# Patient Record
Sex: Male | Born: 1955 | Race: Black or African American | Hispanic: No | Marital: Single | Smoking: Current every day smoker
Health system: Southern US, Community
[De-identification: ages and names within clinical notes are randomized; demographics above are authoritative.]

## PROBLEM LIST (undated history)

## (undated) DIAGNOSIS — E7800 Pure hypercholesterolemia, unspecified: Secondary | ICD-10-CM

## (undated) DIAGNOSIS — I700 Atherosclerosis of aorta: Secondary | ICD-10-CM

## (undated) DIAGNOSIS — I10 Essential (primary) hypertension: Secondary | ICD-10-CM

## (undated) DIAGNOSIS — I6529 Occlusion and stenosis of unspecified carotid artery: Secondary | ICD-10-CM

## (undated) DIAGNOSIS — I2510 Atherosclerotic heart disease of native coronary artery without angina pectoris: Secondary | ICD-10-CM

## (undated) HISTORY — DX: Occlusion and stenosis of unspecified carotid artery: I65.29

## (undated) HISTORY — DX: Atherosclerosis of aorta: I70.0

## (undated) HISTORY — PX: BUNIONECTOMY: SHX129

## (undated) HISTORY — DX: Atherosclerotic heart disease of native coronary artery without angina pectoris: I25.10

## (undated) HISTORY — PX: CATARACT EXTRACTION: SUR2

---

## 2007-07-30 ENCOUNTER — Ambulatory Visit (HOSPITAL_COMMUNITY): Admission: RE | Admit: 2007-07-30 | Discharge: 2007-07-30 | Payer: Self-pay | Admitting: Cardiology

## 2007-08-25 ENCOUNTER — Emergency Department (HOSPITAL_COMMUNITY): Admission: EM | Admit: 2007-08-25 | Discharge: 2007-08-25 | Payer: Self-pay | Admitting: Emergency Medicine

## 2008-12-01 ENCOUNTER — Encounter: Admission: RE | Admit: 2008-12-01 | Discharge: 2008-12-01 | Payer: Self-pay | Admitting: Cardiology

## 2009-04-13 ENCOUNTER — Emergency Department (HOSPITAL_COMMUNITY): Admission: EM | Admit: 2009-04-13 | Discharge: 2009-04-13 | Payer: Self-pay | Admitting: Emergency Medicine

## 2009-12-20 ENCOUNTER — Encounter: Admission: RE | Admit: 2009-12-20 | Discharge: 2009-12-20 | Payer: Self-pay | Admitting: Cardiology

## 2010-11-09 LAB — CBC
MCHC: 33.6
RBC: 5.15

## 2010-11-09 LAB — T4: T4, Total: 6.9

## 2010-11-09 LAB — COMPREHENSIVE METABOLIC PANEL
ALT: 27
AST: 28
CO2: 30
Calcium: 9.6
GFR calc Af Amer: >60
Sodium: 142
Total Protein: 7.3

## 2010-11-09 LAB — LIPID PANEL
HDL: 39 — ABNORMAL LOW
Total CHOL/HDL Ratio: 3.7

## 2010-11-09 LAB — PSA: PSA: 3.86

## 2011-02-28 ENCOUNTER — Encounter (HOSPITAL_COMMUNITY): Payer: Self-pay | Admitting: *Deleted

## 2011-02-28 ENCOUNTER — Emergency Department (HOSPITAL_COMMUNITY)
Admission: EM | Admit: 2011-02-28 | Discharge: 2011-02-28 | Disposition: A | Discharge status: Home / Self Care | Payer: 59 | Source: Home / Self Care | Attending: Family Medicine | Admitting: Family Medicine

## 2011-02-28 DIAGNOSIS — Z202 Contact with and (suspected) exposure to infections with a predominantly sexual mode of transmission: Secondary | ICD-10-CM

## 2011-02-28 DIAGNOSIS — H0019 Chalazion unspecified eye, unspecified eyelid: Secondary | ICD-10-CM

## 2011-02-28 HISTORY — DX: Pure hypercholesterolemia, unspecified: E78.00

## 2011-02-28 HISTORY — DX: Essential (primary) hypertension: I10

## 2011-02-28 MED ORDER — METRONIDAZOLE 500 MG PO TABS: 500.0000 mg | ORAL_TABLET | Freq: Two times a day (BID) | ORAL | Status: AC

## 2011-02-28 NOTE — ED Notes (Addendum)
Pt with exposure to trichomoniases - pt with right eyelid bump x 6 weeks denies pain

## 2011-02-28 NOTE — ED Provider Notes (Signed)
History     CSN: [id number]  Arrival date & time 02/28/11  5366   First MD Initiated Contact with Patient 02/28/11 [phone]      Chief Complaint  Patient presents with  . Exposure to STD  . Eye Problem    (Consider location/radiation/quality/duration/timing/severity/associated sxs/prior treatment) HPI Comments: 56 y/o male here c/o: 1) His wife was recently diagnosed with trichomonas and patient was asked to be checked and treated. Denies discharge from penis, dysuria or testicular pain. 2) has noted a "bump" in the middle of upper lids. Present for several months. No tenderness or discharge.   Past Medical History  Diagnosis Date  . Hypertension   . High cholesterol     History reviewed. No pertinent past surgical history.  History reviewed. No pertinent family history.  History  Substance Use Topics  . Smoking status: Current Everyday Smoker  . Smokeless tobacco: Not on file  . Alcohol Use: Yes      Review of Systems  Constitutional: Negative for fever and chills.  HENT: Negative for sore throat.   Eyes: Negative for photophobia, pain, discharge, redness, itching and visual disturbance.  Genitourinary: Negative for dysuria, frequency, hematuria, discharge, scrotal swelling and testicular pain.  Skin: Negative for rash.    Allergies  Review of patient's allergies indicates no known allergies.  Home Medications   Current Outpatient Rx  Name Route Sig Dispense Refill  . PRESCRIPTION MEDICATION  Pt has prescription for htn med and high cholesterol med unknown names    Has not taken either x one month  Will get filled today per pt    . METRONIDAZOLE 500 MG PO TABS Oral Take 1 tablet (500 mg total) by mouth 2 (two) times daily. 14 tablet 0    BP 158/91  Pulse 76  Temp 98 F (36.7 C)  Resp 16  SpO2 99%  Physical Exam  Nursing note and vitals reviewed. Constitutional: He is oriented to person, place, and time. He appears well-developed and well-nourished. No  distress.  HENT:  Head: Normocephalic and atraumatic.  Right Ear: External ear normal.  Left Ear: External ear normal.  Nose: Nose normal.  Mouth/Throat: Oropharynx is clear and moist. No oropharyngeal exudate.  Eyes: Conjunctivae and EOM are normal. Pupils are equal, round, and reactive to light. Right eye exhibits no discharge. Left eye exhibits no discharge.       There round non tender cystic lesions below bilateral upper lids. No erythema or blepharitis. No spontaneous drainage with compression.   Neck: Neck supple. No JVD present.  Cardiovascular: Normal heart sounds.   Pulmonary/Chest: Effort normal and breath sounds normal.  Abdominal: He exhibits no distension.  Genitourinary: Penis normal. No penile tenderness.  Lymphadenopathy:    He has no cervical adenopathy.  Neurological: He is alert and oriented to person, place, and time.  Skin: No rash noted.    ED Course  Procedures (including critical care time)   Labs Reviewed  GC/CHLAMYDIA PROBE AMP, GENITAL   No results found.   1. Exposure to STD   2. Chalazion       MDM  GC/Chl pending. Treated for trichomonas with flagyl bid for 7 days. Non infected chalazion referred to eye specialist as needed.        Sharin Grave, MD 03/01/11 2318

## 2011-11-24 ENCOUNTER — Emergency Department (HOSPITAL_COMMUNITY)
Admission: EM | Admit: 2011-11-24 | Discharge: 2011-11-24 | Disposition: A | Discharge status: Home / Self Care | Payer: 59 | Source: Home / Self Care | Attending: Emergency Medicine | Admitting: Emergency Medicine

## 2011-11-24 ENCOUNTER — Encounter (HOSPITAL_COMMUNITY): Payer: Self-pay | Admitting: Emergency Medicine

## 2011-11-24 DIAGNOSIS — L723 Sebaceous cyst: Secondary | ICD-10-CM

## 2011-11-24 MED ORDER — DOXYCYCLINE HYCLATE 100 MG PO CAPS: 100.0000 mg | ORAL_CAPSULE | Freq: Two times a day (BID) | ORAL | Status: DC

## 2011-11-24 MED ORDER — HYDROCODONE-ACETAMINOPHEN 5-325 MG PO TABS: 2.0000 | ORAL_TABLET | ORAL | Status: DC | PRN

## 2011-11-24 NOTE — ED Provider Notes (Signed)
History     CSN: [id number]  Arrival date & time 11/24/11  7829   First MD Initiated Contact with Patient 11/24/11 [phone]      Chief Complaint  Patient presents with  . Abscess    (Consider location/radiation/quality/duration/timing/severity/associated sxs/prior treatment) Patient is a 56 y.o. male presenting with abscess. The history is provided by the patient. No language interpreter was used.  Abscess  This is a recurrent problem. The problem has been gradually worsening. The abscess is present on the back. The abscess is characterized by redness. The abscess first occurred at home. There were no sick contacts.   Pt reports he has an abscess on his back.  Pt reports area is getting larger.  Pt has had a knot in the area for a long time Past Medical History  Diagnosis Date  . Hypertension   . High cholesterol     History reviewed. No pertinent past surgical history.  No family history on file.  History  Substance Use Topics  . Smoking status: Current Every Day Smoker -- 1.0 packs/day    Types: Cigarettes  . Smokeless tobacco: Not on file  . Alcohol Use: Yes      Review of Systems  Skin: Positive for wound.  All other systems reviewed and are negative.    Allergies  Review of patient's allergies indicates no known allergies.  Home Medications   Current Outpatient Rx  Name Route Sig Dispense Refill  . PRESCRIPTION MEDICATION  Pt has prescription for htn med and high cholesterol med unknown names    Has not taken either x one month  Will get filled today per pt      BP 161/81  Pulse 69  Temp 98.4 F (36.9 C) (Oral)  Resp 18  SpO2 99%  Physical Exam  Nursing note and vitals reviewed. Constitutional: He appears well-developed and well-nourished.  HENT:  Head: Normocephalic and atraumatic.  Eyes: Conjunctivae normal are normal. Pupils are equal, round, and reactive to light.  Cardiovascular: Normal rate.   Pulmonary/Chest: Effort normal.    Musculoskeletal:       3x3 cm swollen area,  Large sebaceous cyst     ED Course  INCISION AND DRAINAGE Date/Time: 11/24/2011 10:15 AM Performed by: Elson Areas Authorized by: Elson Areas Consent: Verbal consent not obtained. Risks and benefits: risks, benefits and alternatives were discussed Patient understanding: patient does not state understanding of the procedure being performed Patient identity confirmed: verbally with patient Type: cyst Body area: trunk Location details: back Anesthesia: local infiltration Patient sedated: no Scalpel size: 11 Incision type: single straight Complexity: simple Drainage amount: moderate Wound treatment: wound left open Packing material: none Patient tolerance: Patient tolerated the procedure well with no immediate complications. Comments: Large amount of cystic material removed   (including critical care time)  Labs Reviewed - No data to display No results found.   No diagnosis found.    MDM  Bactrim ds         Lonia Skinner [place], [place] 11/24/11 1017

## 2011-11-24 NOTE — ED Notes (Addendum)
Pt c/o of abcess on middle of back x1 year... Had no pain until this week... 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 Sx include: tenderness... Denies: drainage, fevers, vomiting, nausea, diarrhea.

## 2011-11-26 NOTE — ED Provider Notes (Signed)
Medical screening examination/treatment/procedure(s) were performed by non-physician practitioner and as supervising physician I was immediately available for consultation/collaboration.  Leslee Home, M.D.   Reuben Likes, MD 11/26/11 [phone]

## 2016-02-22 DIAGNOSIS — E559 Vitamin D deficiency, unspecified: Secondary | ICD-10-CM | POA: Diagnosis not present

## 2016-04-25 DIAGNOSIS — Z0000 Encounter for general adult medical examination without abnormal findings: Secondary | ICD-10-CM | POA: Diagnosis not present

## 2016-04-25 DIAGNOSIS — Z125 Encounter for screening for malignant neoplasm of prostate: Secondary | ICD-10-CM | POA: Diagnosis not present

## 2016-04-25 DIAGNOSIS — Z13220 Encounter for screening for lipoid disorders: Secondary | ICD-10-CM | POA: Diagnosis not present

## 2016-12-12 DIAGNOSIS — E785 Hyperlipidemia, unspecified: Secondary | ICD-10-CM | POA: Diagnosis not present

## 2016-12-12 DIAGNOSIS — E291 Testicular hypofunction: Secondary | ICD-10-CM | POA: Diagnosis not present

## 2016-12-12 DIAGNOSIS — I10 Essential (primary) hypertension: Secondary | ICD-10-CM | POA: Diagnosis not present

## 2016-12-19 DIAGNOSIS — I10 Essential (primary) hypertension: Secondary | ICD-10-CM | POA: Diagnosis not present

## 2016-12-19 DIAGNOSIS — E291 Testicular hypofunction: Secondary | ICD-10-CM | POA: Diagnosis not present

## 2016-12-19 DIAGNOSIS — E785 Hyperlipidemia, unspecified: Secondary | ICD-10-CM | POA: Diagnosis not present

## 2017-01-16 DIAGNOSIS — Z0100 Encounter for examination of eyes and vision without abnormal findings: Secondary | ICD-10-CM | POA: Diagnosis not present

## 2017-05-01 DIAGNOSIS — Z0000 Encounter for general adult medical examination without abnormal findings: Secondary | ICD-10-CM | POA: Diagnosis not present

## 2017-05-01 DIAGNOSIS — I10 Essential (primary) hypertension: Secondary | ICD-10-CM | POA: Diagnosis not present

## 2017-05-01 DIAGNOSIS — E785 Hyperlipidemia, unspecified: Secondary | ICD-10-CM | POA: Diagnosis not present

## 2017-08-07 DIAGNOSIS — E785 Hyperlipidemia, unspecified: Secondary | ICD-10-CM | POA: Diagnosis not present

## 2017-10-23 DIAGNOSIS — I10 Essential (primary) hypertension: Secondary | ICD-10-CM | POA: Diagnosis not present

## 2017-10-23 DIAGNOSIS — E785 Hyperlipidemia, unspecified: Secondary | ICD-10-CM | POA: Diagnosis not present

## 2017-10-23 DIAGNOSIS — R972 Elevated prostate specific antigen [PSA]: Secondary | ICD-10-CM | POA: Diagnosis not present

## 2018-04-12 DIAGNOSIS — H40033 Anatomical narrow angle, bilateral: Secondary | ICD-10-CM | POA: Diagnosis not present

## 2018-04-12 DIAGNOSIS — H2513 Age-related nuclear cataract, bilateral: Secondary | ICD-10-CM | POA: Diagnosis not present

## 2019-05-13 ENCOUNTER — Other Ambulatory Visit: Payer: Self-pay | Admitting: Family Medicine

## 2019-05-13 DIAGNOSIS — R1011 Right upper quadrant pain: Secondary | ICD-10-CM

## 2019-05-27 ENCOUNTER — Ambulatory Visit
Admission: RE | Admit: 2019-05-27 | Discharge: 2019-05-27 | Disposition: A | Discharge status: Home / Self Care | Payer: 59 | Source: Ambulatory Visit | Attending: Family Medicine | Admitting: Family Medicine

## 2019-05-27 DIAGNOSIS — R1011 Right upper quadrant pain: Secondary | ICD-10-CM

## 2020-04-15 ENCOUNTER — Other Ambulatory Visit: Payer: Self-pay | Admitting: Family Medicine

## 2020-04-15 DIAGNOSIS — Z122 Encounter for screening for malignant neoplasm of respiratory organs: Secondary | ICD-10-CM

## 2020-04-15 DIAGNOSIS — R1033 Periumbilical pain: Secondary | ICD-10-CM

## 2020-05-04 ENCOUNTER — Other Ambulatory Visit: Payer: Self-pay

## 2020-05-04 ENCOUNTER — Ambulatory Visit
Admission: RE | Admit: 2020-05-04 | Discharge: 2020-05-04 | Disposition: A | Discharge status: Home / Self Care | Payer: 59 | Source: Ambulatory Visit | Attending: Family Medicine | Admitting: Family Medicine

## 2020-05-04 DIAGNOSIS — Z122 Encounter for screening for malignant neoplasm of respiratory organs: Secondary | ICD-10-CM

## 2020-05-04 DIAGNOSIS — R1033 Periumbilical pain: Secondary | ICD-10-CM

## 2020-05-04 MED ORDER — IOPAMIDOL (ISOVUE-300) INJECTION 61%
100.0000 mL | Freq: Once | INTRAVENOUS | Status: AC | PRN
  Administered 2020-05-04: 100 mL via INTRAVENOUS

## 2020-05-24 NOTE — Progress Notes (Signed)
Primary Physician/Referring:  Aura Dials, MD Reason for referral: Abnormal chest CT Patient ID: Gregory Barajas, male    DOB: 02/07/1956, 65 y.o.   MRN: [id number]  Chief Complaint  Patient presents with  . Abnormal Chest CT  . Coronary Artery Disease   HPI:    Gregory Barajas  is a 65 y.o. African-American male with history of hypertension, hyperlipidemia, and current pack per day smoker for the last 30 years.  Patient denies history of diabetes, MI, TIA/CVA, family history of premature CAD.  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  Patient was referred to our office by PCP Dr. Sheryn Bison after recent CT scanning of the chest for lung cancer screening revealed coronary artery calcifications and aortic atherosclerosis.  Patient denies chest pain, palpitations, syncope, near syncope, dizziness, leg swelling, orthopnea, PND.  Patient does report a long history of dyspnea on exertion, which she is always attributed to smoking.  Patient does admit to poor diet and drinks 1-2 beers per night.  He also eats out approximately 3-4 times per week.  He has had no prior cardiac evaluation.  Past Medical History:  Diagnosis Date  . High cholesterol   . Hypertension    Past Surgical History:  Procedure Laterality Date  . BUNIONECTOMY    . CATARACT EXTRACTION     Family History  Problem Relation Age of Onset  . Cancer Father     Social History   Tobacco Use  . Smoking status: Current Every Day Smoker    Packs/day: 1.00    Types: Cigarettes  . Smokeless tobacco: Not on file  Substance Use Topics  . Alcohol use: Yes   Marital Status: Single   ROS  Review of Systems  Constitutional: Negative for malaise/fatigue and weight gain.  Cardiovascular: Negative for chest pain, claudication, leg swelling, near-syncope, orthopnea, palpitations, paroxysmal nocturnal dyspnea and syncope.  Respiratory: Negative for shortness of breath.   Hematologic/Lymphatic: Does not  bruise/bleed easily.  Gastrointestinal: Negative for melena.  Neurological: Negative for dizziness and weakness.    Objective  Blood pressure 126/68, pulse 86, temperature 98 F (36.7 C), height 5' 7"  (1.702 m), weight 172 lb (78 kg), SpO2 97 %.  Vitals with BMI 05/25/2020 11/24/2011  Height 5' 7"  -  Weight 172 lbs -  BMI 51.02 -  Systolic 585 277  Diastolic 68 81  Pulse 86 69  Some encounter information is confidential and restricted. Go to Review Flowsheets activity to see all data.      Physical Exam Vitals reviewed.  HENT:     Head: Normocephalic and atraumatic.  Cardiovascular:     Rate and Rhythm: Normal rate and regular rhythm.     Pulses: Intact distal pulses.          Carotid pulses are 2+ on the right side and 2+ on the left side.      Radial pulses are 2+ on the right side and 2+ on the left side.       Femoral pulses are 2+ on the right side and 2+ on the left side.      Popliteal pulses are 2+ on the right side and 2+ on the left side.       Dorsalis pedis pulses are 2+ on the right side and 2+ on the left side.       Posterior tibial pulses are 2+ on the right side and 2+ on the left side.     Heart sounds: S1 normal  and S2 normal. No murmur heard. No gallop.   Pulmonary:     Effort: Pulmonary effort is normal. No respiratory distress.     Breath sounds: No wheezing, rhonchi or rales.  Abdominal:     General: Bowel sounds are normal. There is no distension.     Palpations: Abdomen is soft.  Musculoskeletal:     Right lower leg: No edema.     Left lower leg: No edema.  Neurological:     Mental Status: He is alert.     Laboratory examination:   No results for input(s): NA, K, CL, CO2, GLUCOSE, BUN, CREATININE, CALCIUM, GFRNONAA, GFRAA in the last 8760 hours. CrCl cannot be calculated (Patient's most recent lab result is older than the maximum 21 days allowed.).  CMP 07/30/2007  Glucose 105(H)  BUN 10  Creatinine 1.12  Sodium 142  Potassium 4.2   Chloride 106  CO2 30  Calcium 9.6  Total Protein 7.3  Total Bilirubin 0.7  Alkaline Phos 65  AST 28  ALT 27   CBC 07/30/2007  WBC 6.5  Hemoglobin 15.0  Hematocrit 44.5  Platelets 307    Lipid Panel No results for input(s): CHOL, TRIG, LDLCALC, VLDL, HDL, CHOLHDL, LDLDIRECT in the last 8760 hours.  HEMOGLOBIN A1C No results found for: HGBA1C, MPG TSH No results for input(s): TSH in the last 8760 hours.  External labs:   04/01/2020: Hemoglobin 15.2, hematocrit 46.1, MCV 85.9, platelet 285 Glucose 75, BUN 11, creatinine 1.24, sodium 140, potassium 4.2, alk phos 95, AST 23, ALT 22 Total cholesterol 178, HDL 56, triglycerides 93, LDL 105  Medications and allergies  No Known Allergies   Outpatient Medications Prior to Visit  Medication Sig Dispense Refill  . amLODipine (NORVASC) 10 MG tablet Take 1 tablet by mouth daily.    Marland Kitchen lisinopril (ZESTRIL) 20 MG tablet Take 1 tablet by mouth daily.    . rosuvastatin (CRESTOR) 20 MG tablet Take 20 mg by mouth daily.    . tadalafil (CIALIS) 20 MG tablet Take 20 mg by mouth as directed.    . clidinium-chlordiazePOXIDE (LIBRAX) 5-2.5 MG capsule Take 1 capsule by mouth in the morning, at noon, and at bedtime.    . Vitamin D, Ergocalciferol, (DRISDOL) 1.25 MG (50000 UNIT) CAPS capsule Take 5,000 mcg by mouth daily.    Marland Kitchen atorvastatin (LIPITOR) 80 MG tablet Take 1 tablet by mouth daily.    Marland Kitchen doxycycline (VIBRAMYCIN) 100 MG capsule Take 1 capsule (100 mg total) by mouth 2 (two) times daily. 20 capsule 0  . HYDROcodone-acetaminophen (NORCO/VICODIN) 5-325 MG per tablet Take 2 tablets by mouth every 4 (four) hours as needed for pain. 10 tablet 0  . PRESCRIPTION MEDICATION Pt has prescription for htn med and high cholesterol med unknown names    Has not taken either x one month  Will get filled today per pt     No facility-administered medications prior to visit.     Radiology:   No results found. CT abdomen pelvis with contrast  05/04/2020: Lower chest: Minimal dependent bibasilar atelectasis or scarring. Hepatobiliary: No solid liver abnormality is seen. No gallstones,gallbladder wall thickening, or biliary dilatation. Pancreas: Unremarkable. No pancreatic ductal dilatation orsurrounding inflammatory changes. Spleen: Normal in size without significant abnormality. Adrenals/Urinary Tract: Adrenal glands are unremarkable. Kidneys arenormal, without renal calculi, solid lesion, or hydronephrosis.Bladder is unremarkable. Stomach/Bowel: Stomach is within normal limits. Appendix appearsnormal. No evidence of bowel wall thickening, distention, orinflammatory changes. Vascular/Lymphatic: Aortic atherosclerosis. No enlarged abdominal or pelvic lymph  nodes.  CT chest lung cancer screening 05/04/2020: 1. Lung-RADS 2S, benign appearance or behavior. Continue annual screening with low-dose chest CT without contrast in 12 months. 2. The "S" modifier above refers to potentially clinically significant non lung cancer related findings. Specifically, there is aortic atherosclerosis, in addition to left main and 2 vessel coronary artery disease. Please note that although the presence of coronary artery calcium documents the presence of coronary artery disease, the severity of this disease and any potential stenosis cannot be assessed on this non-gated CT examination. Assessment for potential risk factor modification, dietary therapy or pharmacologic therapy may be warranted, if clinically indicated. 3. Mild diffuse bronchial wall thickening with very mild centrilobular and paraseptal emphysema; imaging findings suggestive of underlying COPD. Aortic Atherosclerosis (ICD10-I70.0) and Emphysema (ICD10-J43.9).  Cardiac Studies:   None  EKG:   05/25/2020: Sinus rhythm at a rate of 82 bpm.  Biatrial enlargement.  Left axis, left anterior fascicular block.  Poor R wave progression, cannot exclude anteroseptal infarct old.  Nonspecific T  wave abnormality.  Assessment     ICD-10-CM   1. Atherosclerosis of native coronary artery of native heart without angina pectoris  I25.10 EKG 12-Lead    PCV ECHOCARDIOGRAM COMPLETE    PCV MYOCARDIAL PERFUSION WO LEXISCAN    aspirin EC 81 MG tablet  2. Atherosclerosis of aorta (HCC)  I70.0 aspirin EC 81 MG tablet  3. Dyspnea on exertion  R06.00 PCV ECHOCARDIOGRAM COMPLETE    PCV MYOCARDIAL PERFUSION WO LEXISCAN     Medications Discontinued During This Encounter  Medication Reason  . doxycycline (VIBRAMYCIN) 100 MG capsule Error  . HYDROcodone-acetaminophen (NORCO/VICODIN) 5-325 MG per tablet Error  . PRESCRIPTION MEDICATION Error  . clidinium-chlordiazePOXIDE (LIBRAX) 5-2.5 MG capsule   . atorvastatin (LIPITOR) 80 MG tablet Error  . Vitamin D, Ergocalciferol, (DRISDOL) 1.25 MG (50000 UNIT) CAPS capsule Error    Meds ordered this encounter  Medications  . aspirin EC 81 MG tablet    Sig: Take 1 tablet (81 mg total) by mouth daily. Swallow whole.    Dispense:  90 tablet    Refill:  3    Recommendations:   Arihaan Bellucci is a 65 y.o. African-American male with history of hypertension, hyperlipidemia, and current pack per day smoker for the last 30 years.  Patient denies history of diabetes, MI, TIA/CVA, family history of premature CAD.  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  Patient was referred to our office by PCP Dr. Sheryn Bison after recent CT scanning of the chest for lung cancer screening revealed coronary artery calcifications and aortic atherosclerosis.  Patient's blood pressure is well controlled, will defer further management to his PCP.  Review of external labs revealed lipids are not well controlled, however patient recently started started on rosuvastatin by PCP.  Will defer further management of hyperlipidemia to primary care at this time as well.  In regard to recent CT, I have personally reviewed the results and in view of coronary artery  calcification and patient's other underlying risk factors for coronary artery disease, as well as patient's dyspnea on exertion, recommend echocardiogram and stress test.  Also recommend patient start taking aspirin 81 mg daily in view of coronary artery disease.  Otherwise advised patient to continue present medications including amlodipine, lisinopril, and rosuvastatin.  In regard to patient's symptoms of dyspnea on exertion, do suspect this is likely multifactorial including smoking and underlying lung pathology.  Also patient has no formal exercise routine.  Counseled patient  at length regarding diet and lifestyle modifications to reduce cardiovascular risk including smoking cessation, diet compliance, and increased physical activity.  Patient verbalized understanding agreement.  Patient appears motivated to quit smoking as well.  Follow-up in 8 weeks, sooner if needed, for results of cardiac testing.   Alethia Berthold, PA-C 05/27/2020, 1:29 PM Office: [phone]

## 2020-05-25 ENCOUNTER — Encounter: Payer: Self-pay | Admitting: Student

## 2020-05-25 ENCOUNTER — Ambulatory Visit: Payer: 59 | Admitting: Student

## 2020-05-25 ENCOUNTER — Other Ambulatory Visit: Payer: Self-pay

## 2020-05-25 ENCOUNTER — Ambulatory Visit: Payer: Self-pay | Admitting: Cardiology

## 2020-05-25 VITALS — BP 126/68 | HR 86 | Temp 98.0°F | Ht 67.0 in | Wt 172.0 lb

## 2020-05-25 DIAGNOSIS — R0600 Dyspnea, unspecified: Secondary | ICD-10-CM

## 2020-05-25 DIAGNOSIS — I700 Atherosclerosis of aorta: Secondary | ICD-10-CM

## 2020-05-25 DIAGNOSIS — I2510 Atherosclerotic heart disease of native coronary artery without angina pectoris: Secondary | ICD-10-CM

## 2020-05-25 DIAGNOSIS — R0609 Other forms of dyspnea: Secondary | ICD-10-CM

## 2020-05-25 MED ORDER — ASPIRIN EC 81 MG PO TBEC: 81.0000 mg | DELAYED_RELEASE_TABLET | Freq: Every day | ORAL | 3 refills | Status: DC

## 2020-05-27 ENCOUNTER — Encounter: Payer: Self-pay | Admitting: Student

## 2020-06-15 ENCOUNTER — Ambulatory Visit: Payer: 59

## 2020-06-15 ENCOUNTER — Other Ambulatory Visit: Payer: Self-pay

## 2020-06-15 DIAGNOSIS — R0600 Dyspnea, unspecified: Secondary | ICD-10-CM

## 2020-06-15 DIAGNOSIS — I2510 Atherosclerotic heart disease of native coronary artery without angina pectoris: Secondary | ICD-10-CM

## 2020-06-15 DIAGNOSIS — R0609 Other forms of dyspnea: Secondary | ICD-10-CM

## 2020-06-20 NOTE — Progress Notes (Signed)
Please inform patient echocardiogram revealed normal heart function with leaking of aortic and mitral valves.  We will discuss further at upcoming office visit following additional cardiovascular testing.

## 2020-06-21 NOTE — Progress Notes (Signed)
Patient is aware 

## 2020-06-29 ENCOUNTER — Other Ambulatory Visit: Payer: Self-pay

## 2020-06-29 ENCOUNTER — Ambulatory Visit: Payer: 59

## 2020-06-29 DIAGNOSIS — R0609 Other forms of dyspnea: Secondary | ICD-10-CM

## 2020-06-29 DIAGNOSIS — I2510 Atherosclerotic heart disease of native coronary artery without angina pectoris: Secondary | ICD-10-CM

## 2020-06-29 DIAGNOSIS — R0600 Dyspnea, unspecified: Secondary | ICD-10-CM

## 2020-06-30 NOTE — Progress Notes (Signed)
Please advise patient stress test was low risk, will discuss further at upcoming office visit.

## 2020-06-30 NOTE — Progress Notes (Signed)
Called and spoke with patient regarding his stress test results.

## 2020-07-19 NOTE — Progress Notes (Signed)
Primary Physician/Referring:  Aura Dials, MD Reason for referral: Abnormal chest CT Patient ID: Gregory Barajas, male    DOB: [birth year], 65 y.o.   MRN: [id number]  Chief Complaint  Patient presents with  . Follow-up    8 WEEK   HPI:    Gregory Barajas  is a 65 y.o. African-American male with history of hypertension, hyperlipidemia, and current pack per day smoker for the last 30 years.  Patient denies history of diabetes, MI, TIA/CVA, family history of premature CAD.  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  He was originally referred to our office by Dr. Sheryn Bison after incidental finding of coronary artery calcifications and aortic atherosclerosis on chest CT for lung cancer screening.  Patient presents for 8 week follow up for results of cardiac testing.  Patient remains asymptomatic, denies chest pain, palpitations, syncope, near syncope, dizziness, leg swelling, orthopnea, PND.  Unfortunately he does continue to smoke approximately a pack per day and drinks 1-2 beers per night.  Patient appears motivated to quit smoking, stating that he quit for 8 years in the past and plans to do it again.  Since last visit patient underwent stress test which was low risk and echocardiogram revealing normal LVEF, hypertensive heart disease, and moderate aortic regurgitation and mitral regurgitation.  Past Medical History:  Diagnosis Date  . High cholesterol   . Hypertension    Past Surgical History:  Procedure Laterality Date  . BUNIONECTOMY    . CATARACT EXTRACTION     Family History  Problem Relation Age of Onset  . Cancer Brother     Social History   Tobacco Use  . Smoking status: Current Every Day Smoker    Packs/day: 1.00    Types: Cigarettes  . Smokeless tobacco: Never Used  Substance Use Topics  . Alcohol use: Yes    Alcohol/week: 1.0 standard drink    Types: 1 Glasses of wine per week    Comment: OCC   Marital Status: Single   ROS  Review of  Systems  Constitutional: Negative for malaise/fatigue and weight gain.  Cardiovascular: Negative for chest pain, claudication, leg swelling, near-syncope, orthopnea, palpitations, paroxysmal nocturnal dyspnea and syncope.  Respiratory: Negative for shortness of breath.   Hematologic/Lymphatic: Does not bruise/bleed easily.  Gastrointestinal: Negative for melena.  Neurological: Negative for dizziness and weakness.    Objective  Blood pressure 135/60, pulse 67, temperature 98.6 F (37 C), temperature source Temporal, resp. rate 17, height 5' 7"  (1.702 m), weight 175 lb 6.4 oz (79.6 kg), SpO2 95 %.  Vitals with BMI 07/20/2020 07/20/2020 05/25/2020  Height - 5' 7"  5' 7"   Weight - 175 lbs 6 oz 172 lbs  BMI - 34.91 79.15  Systolic 056 979 480  Diastolic 60 69 68  Pulse 67 72 86  Some encounter information is confidential and restricted. Go to Review Flowsheets activity to see all data.      Physical Exam Vitals reviewed.  Cardiovascular:     Rate and Rhythm: Normal rate and regular rhythm.     Pulses: Intact distal pulses.          Carotid pulses are 2+ on the right side and 2+ on the left side.      Radial pulses are 2+ on the right side and 2+ on the left side.       Femoral pulses are 2+ on the right side and 2+ on the left side.      Popliteal pulses are  2+ on the right side and 2+ on the left side.       Dorsalis pedis pulses are 2+ on the right side and 2+ on the left side.       Posterior tibial pulses are 2+ on the right side and 2+ on the left side.     Heart sounds: S1 normal and S2 normal. No murmur heard. No gallop.   Pulmonary:     Effort: Pulmonary effort is normal. No respiratory distress.     Breath sounds: No wheezing, rhonchi or rales.  Musculoskeletal:     Right lower leg: No edema.     Left lower leg: No edema.  Neurological:     Mental Status: He is alert.     Laboratory examination:   No results for input(s): NA, K, CL, CO2, GLUCOSE, BUN, CREATININE,  CALCIUM, GFRNONAA, GFRAA in the last 8760 hours. CrCl cannot be calculated (Patient's most recent lab result is older than the maximum 21 days allowed.).  CMP 07/30/2007  Glucose 105(H)  BUN 10  Creatinine 1.12  Sodium 142  Potassium 4.2  Chloride 106  CO2 30  Calcium 9.6  Total Protein 7.3  Total Bilirubin 0.7  Alkaline Phos 65  AST 28  ALT 27   CBC 07/30/2007  WBC 6.5  Hemoglobin 15.0  Hematocrit 44.5  Platelets 307    Lipid Panel No results for input(s): CHOL, TRIG, LDLCALC, VLDL, HDL, CHOLHDL, LDLDIRECT in the last 8760 hours.  HEMOGLOBIN A1C No results found for: HGBA1C, MPG TSH No results for input(s): TSH in the last 8760 hours.  External labs:   04/01/2020: Hemoglobin 15.2, hematocrit 46.1, MCV 85.9, platelet 285 Glucose 75, BUN 11, creatinine 1.24, sodium 140, potassium 4.2, alk phos 95, AST 23, ALT 22 Total cholesterol 178, HDL 56, triglycerides 93, LDL 105  Allergies  No Known Allergies    Medications Prior to Visit:   Outpatient Medications Prior to Visit  Medication Sig Dispense Refill  . amLODipine (NORVASC) 10 MG tablet Take 1 tablet by mouth daily.    Marland Kitchen aspirin EC 81 MG tablet Take 1 tablet (81 mg total) by mouth daily. Swallow whole. 90 tablet 3  . lisinopril (ZESTRIL) 20 MG tablet Take 1 tablet by mouth daily.    . rosuvastatin (CRESTOR) 20 MG tablet Take 20 mg by mouth daily.    . tadalafil (CIALIS) 20 MG tablet Take 20 mg by mouth as directed.     No facility-administered medications prior to visit.     Final Medications at End of Visit    Current Meds  Medication Sig  . amLODipine (NORVASC) 10 MG tablet Take 1 tablet by mouth daily.  Marland Kitchen aspirin EC 81 MG tablet Take 1 tablet (81 mg total) by mouth daily. Swallow whole.  . lisinopril (ZESTRIL) 20 MG tablet Take 1 tablet by mouth daily.  . metoprolol succinate (TOPROL XL) 25 MG 24 hr tablet Take 1 tablet (25 mg total) by mouth daily.  . rosuvastatin (CRESTOR) 20 MG tablet Take 20 mg by  mouth daily.  . tadalafil (CIALIS) 20 MG tablet Take 20 mg by mouth as directed.    Radiology:   No results found. CT abdomen pelvis with contrast 05/04/2020: Lower chest: Minimal dependent bibasilar atelectasis or scarring. Hepatobiliary: No solid liver abnormality is seen. No gallstones,gallbladder wall thickening, or biliary dilatation. Pancreas: Unremarkable. No pancreatic ductal dilatation orsurrounding inflammatory changes. Spleen: Normal in size without significant abnormality. Adrenals/Urinary Tract: Adrenal glands are unremarkable. Kidneys arenormal, without  renal calculi, solid lesion, or hydronephrosis.Bladder is unremarkable. Stomach/Bowel: Stomach is within normal limits. Appendix appearsnormal. No evidence of bowel wall thickening, distention, orinflammatory changes. Vascular/Lymphatic: Aortic atherosclerosis. No enlarged abdominal or pelvic lymph nodes.  CT chest lung cancer screening 05/04/2020: 1. Lung-RADS 2S, benign appearance or behavior. Continue annual screening with low-dose chest CT without contrast in 12 months. 2. The "S" modifier above refers to potentially clinically significant non lung cancer related findings. Specifically, there is aortic atherosclerosis, in addition to left main and 2 vessel coronary artery disease. Please note that although the presence of coronary artery calcium documents the presence of coronary artery disease, the severity of this disease and any potential stenosis cannot be assessed on this non-gated CT examination. Assessment for potential risk factor modification, dietary therapy or pharmacologic therapy may be warranted, if clinically indicated. 3. Mild diffuse bronchial wall thickening with very mild centrilobular and paraseptal emphysema; imaging findings suggestive of underlying COPD. Aortic Atherosclerosis (ICD10-I70.0) and Emphysema (ICD10-J43.9).  Cardiac Studies:   PCV ECHOCARDIOGRAM COMPLETE 06/15/2020 Left ventricle  cavity is normal in size. Mild concentric hypertrophy of the left ventricle. Normal global wall motion. Normal LV systolic function with EF 55%. Doppler evidence of grade II (pseudonormal) diastolic dysfunction, elevated LAP. Left atrial cavity is severely dilated. Trileaflet aortic valve.  Moderate (Grade III) aortic regurgitation. Moderate (Grade II) mitral regurgitation. Mild to moderate tricuspid regurgitation. Estimated pulmonary artery systolic pressure 28 mmHg.   PCV MYOCARDIAL PERFUSION WO LEXISCAN 06/29/2020 1 Day Rest/Stress Protocol. Exercise time 6 minutes, achieved 7.23METS, and 86% of age predicted maximum heart rate. No convincing evidence of reversible myocardial ischemia or prior infarct. Left ventricular ejection fraction is 56% with normal wall motion. Low risk study. No prior studies for comparison.  EKG:   05/25/2020: Sinus rhythm at a rate of 82 bpm.  Biatrial enlargement.  Left axis, left anterior fascicular block.  Poor R wave progression, cannot exclude anteroseptal infarct old.  Nonspecific T wave abnormality.  Assessment     ICD-10-CM   1. Atherosclerosis of native coronary artery of native heart without angina pectoris  I25.10   2. Atherosclerosis of aorta (HCC)  I70.0   3. Moderate mitral regurgitation  I34.0   4. Moderate aortic regurgitation  I35.1   5. Diastolic dysfunction without heart failure  I51.89   6. Nicotine dependence, cigarettes, uncomplicated  J47.829      There are no discontinued medications.  Meds ordered this encounter  Medications  . metoprolol succinate (TOPROL XL) 25 MG 24 hr tablet    Sig: Take 1 tablet (25 mg total) by mouth daily.    Dispense:  30 tablet    Refill:  3    Recommendations:   Williams Dietrick is a 65 y.o. African-American male with history of hypertension, hyperlipidemia, and current pack per day smoker for the last 30 years.  Patient denies history of diabetes, MI, TIA/CVA, family history of premature CAD.   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  Patient presents for 8 week follow up for results of cardiac testing.  Reviewed and discussed with patient regarding results of nuclear stress test and echocardiogram, details above.  Echocardiogram revealed normal LVEF with hypertensive heart disease and moderate aortic regurgitation mitral regurgitation.  Advised patient we will continue to monitor with regular echocardiogram surveillance.  Patient's blood pressure is mildly elevated in the office today, will therefore start metoprolol succinate 25 mg once daily.  Notably patient had lab work drawn by PCP Dr. March Rummage  office earlier today, will request records.  Patient remains asymptomatic, will therefore continue medical therapy and primary prevention.  Continue aspirin, rosuvastatin, lisinopril, and add metoprolol.  Counseled patient regarding diet and lifestyle modifications.  Spent 5 minutes counseling patient regarding tobacco cessation.  Patient agrees to commit to July 22, 2020 as quit date.  Patient is otherwise stable from a cardiovascular standpoint.  Follow-up in 3 months, sooner if needed hyperlipidemia, hypertension, MR, AR, diastolic dysfunction, and smoking cessation.   Alethia Berthold, PA-C 07/20/2020, 2:07 PM Office: [phone]

## 2020-07-20 ENCOUNTER — Ambulatory Visit: Payer: 59 | Admitting: Student

## 2020-07-20 ENCOUNTER — Other Ambulatory Visit: Payer: Self-pay

## 2020-07-20 ENCOUNTER — Encounter: Payer: Self-pay | Admitting: Student

## 2020-07-20 VITALS — BP 135/60 | HR 67 | Temp 98.6°F | Resp 17 | Ht 67.0 in | Wt 175.4 lb

## 2020-07-20 DIAGNOSIS — I2510 Atherosclerotic heart disease of native coronary artery without angina pectoris: Secondary | ICD-10-CM

## 2020-07-20 DIAGNOSIS — I340 Nonrheumatic mitral (valve) insufficiency: Secondary | ICD-10-CM

## 2020-07-20 DIAGNOSIS — I700 Atherosclerosis of aorta: Secondary | ICD-10-CM

## 2020-07-20 DIAGNOSIS — I351 Nonrheumatic aortic (valve) insufficiency: Secondary | ICD-10-CM

## 2020-07-20 DIAGNOSIS — F17210 Nicotine dependence, cigarettes, uncomplicated: Secondary | ICD-10-CM

## 2020-07-20 DIAGNOSIS — I5189 Other ill-defined heart diseases: Secondary | ICD-10-CM

## 2020-07-20 MED ORDER — METOPROLOL SUCCINATE ER 25 MG PO TB24: 25.0000 mg | ORAL_TABLET | Freq: Every day | ORAL | 3 refills | Status: DC

## 2020-10-19 ENCOUNTER — Encounter: Payer: Self-pay | Admitting: Student

## 2020-10-19 ENCOUNTER — Ambulatory Visit: Payer: 59 | Admitting: Student

## 2020-10-19 ENCOUNTER — Other Ambulatory Visit: Payer: Self-pay

## 2020-10-19 VITALS — BP 140/63 | HR 71 | Temp 98.7°F | Resp 16 | Ht 67.0 in | Wt 174.0 lb

## 2020-10-19 DIAGNOSIS — F17210 Nicotine dependence, cigarettes, uncomplicated: Secondary | ICD-10-CM

## 2020-10-19 DIAGNOSIS — I10 Essential (primary) hypertension: Secondary | ICD-10-CM

## 2020-10-19 DIAGNOSIS — I2510 Atherosclerotic heart disease of native coronary artery without angina pectoris: Secondary | ICD-10-CM

## 2020-10-19 MED ORDER — METOPROLOL SUCCINATE ER 50 MG PO TB24: 50.0000 mg | ORAL_TABLET | Freq: Every day | ORAL | 3 refills | Status: DC

## 2020-10-19 NOTE — Progress Notes (Signed)
Primary Physician/Referring:  Aura Dials, MD Reason for referral: Abnormal chest CT Patient ID: Gregory Barajas, male    DOB: 1955/04/02, 65 y.o.   MRN: [id number]  Chief Complaint  Patient presents with   Atherosclerosis of native coronary artery of native heart w   Follow-up    3 month   Hypertension   Hyperlipidemia   HPI:    Gregory Barajas  is a 65 y.o. African-American male with history of hypertension, hyperlipidemia, and current pack per day smoker for the last 30 years.  Patient denies history of diabetes, MI, TIA/CVA, family history of premature CAD.  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  He was originally referred to our office by Dr. Sheryn Bison after incidental finding of coronary artery calcifications and aortic atherosclerosis on chest CT for lung cancer screening.  Patient subsequently underwent low risk stress test and echocardiogram revealing normal LVEF, hypertensive heart disease, and moderate MR and AR.  Patient presents for 26-monthfollow-up of hyperlipidemia, hypertension, smoking cessation.  At last office visit added metoprolol succinate 25 mg daily.  Patient is tolerating addition of metoprolol without issue.  He recently underwent umbilical hernia repair for which she is recovering well.  Patient walks around his neighborhood regularly without issue, however does admit that it is not a strenuous walk.  He does not monitor his blood pressure at home.  Unfortunately patient continues to smoke 1/2-1 pack/day and drink 1-2 beers per night.  Patient remains asymptomatic, denies chest pain, palpitations, syncope, near syncope, dizziness, leg swelling, orthopnea, PND.    Past Medical History:  Diagnosis Date   High cholesterol    Hypertension    Past Surgical History:  Procedure Laterality Date   BUNIONECTOMY     CATARACT EXTRACTION     Family History  Problem Relation Age of Onset   Cancer Brother     Social History   Tobacco Use    Smoking status: Every Day    Packs/day: 1.00    Types: Cigarettes   Smokeless tobacco: Never  Substance Use Topics   Alcohol use: Yes    Alcohol/week: 1.0 standard drink    Types: 1 Glasses of wine per week    Comment: OCC   Marital Status: Single   ROS  Review of Systems  Constitutional: Negative for malaise/fatigue and weight gain.  Cardiovascular:  Negative for chest pain, claudication, leg swelling, near-syncope, orthopnea, palpitations, paroxysmal nocturnal dyspnea and syncope.  Respiratory:  Negative for shortness of breath.   Neurological:  Negative for dizziness and weakness.   Objective  Blood pressure 140/63, pulse 71, temperature 98.7 F (37.1 C), resp. rate 16, height 5' 7"  (1.702 m), weight 174 lb (78.9 kg), SpO2 97 %.  Vitals with BMI 10/19/2020 07/20/2020 07/20/2020  Height 5' 7"  - 5' 7"   Weight 174 lbs - 175 lbs 6 oz  BMI 230.07- 262.26 Systolic [id number] Diastolic 63 60 69  Pulse 71 67 72  Some encounter information is confidential and restricted. Go to Review Flowsheets activity to see all data.      Physical Exam Vitals reviewed.  HENT:     Head: Normocephalic and atraumatic.  Cardiovascular:     Rate and Rhythm: Normal rate and regular rhythm.     Pulses: Intact distal pulses.          Carotid pulses are 2+ on the right side and 2+ on the left side.      Radial pulses are  2+ on the right side and 2+ on the left side.       Femoral pulses are 2+ on the right side and 2+ on the left side.      Popliteal pulses are 2+ on the right side and 2+ on the left side.       Dorsalis pedis pulses are 2+ on the right side and 2+ on the left side.       Posterior tibial pulses are 2+ on the right side and 2+ on the left side.     Heart sounds: S1 normal and S2 normal. No murmur heard.   No gallop.  Pulmonary:     Effort: Pulmonary effort is normal. No respiratory distress.     Breath sounds: No wheezing, rhonchi or rales.  Musculoskeletal:     Right lower leg:  No edema.     Left lower leg: No edema.  Skin:    General: Skin is warm and dry.  Neurological:     Mental Status: He is alert.    Laboratory examination:   No results for input(s): NA, K, CL, CO2, GLUCOSE, BUN, CREATININE, CALCIUM, GFRNONAA, GFRAA in the last 8760 hours. CrCl cannot be calculated (Patient's most recent lab result is older than the maximum 21 days allowed.).  CMP 07/30/2007  Glucose 105(H)  BUN 10  Creatinine 1.12  Sodium 142  Potassium 4.2  Chloride 106  CO2 30  Calcium 9.6  Total Protein 7.3  Total Bilirubin 0.7  Alkaline Phos 65  AST 28  ALT 27   CBC 07/30/2007  WBC 6.5  Hemoglobin 15.0  Hematocrit 44.5  Platelets 307    Lipid Panel No results for input(s): CHOL, TRIG, LDLCALC, VLDL, HDL, CHOLHDL, LDLDIRECT in the last 8760 hours.  HEMOGLOBIN A1C No results found for: HGBA1C, MPG TSH No results for input(s): TSH in the last 8760 hours.  External labs:   04/01/2020: Hemoglobin 15.2, hematocrit 46.1, MCV 85.9, platelet 285 Glucose 75, BUN 11, creatinine 1.24, sodium 140, potassium 4.2, alk phos 95, AST 23, ALT 22 Total cholesterol 178, HDL 56, triglycerides 93, LDL 105  Allergies  No Known Allergies  Medications Prior to Visit:   Outpatient Medications Prior to Visit  Medication Sig Dispense Refill   amLODipine (NORVASC) 10 MG tablet Take 1 tablet by mouth daily.     aspirin EC 81 MG tablet Take 1 tablet (81 mg total) by mouth daily. Swallow whole. 90 tablet 3   lisinopril (ZESTRIL) 20 MG tablet Take 1 tablet by mouth daily.     rosuvastatin (CRESTOR) 20 MG tablet Take 20 mg by mouth daily.     tadalafil (CIALIS) 20 MG tablet Take 20 mg by mouth as directed.     metoprolol succinate (TOPROL XL) 25 MG 24 hr tablet Take 1 tablet (25 mg total) by mouth daily. 30 tablet 3   No facility-administered medications prior to visit.   Final Medications at End of Visit    Current Meds  Medication Sig   amLODipine (NORVASC) 10 MG tablet Take 1  tablet by mouth daily.   aspirin EC 81 MG tablet Take 1 tablet (81 mg total) by mouth daily. Swallow whole.   lisinopril (ZESTRIL) 20 MG tablet Take 1 tablet by mouth daily.   rosuvastatin (CRESTOR) 20 MG tablet Take 20 mg by mouth daily.   tadalafil (CIALIS) 20 MG tablet Take 20 mg by mouth as directed.   [DISCONTINUED] metoprolol succinate (TOPROL XL) 25 MG 24 hr tablet Take  1 tablet (25 mg total) by mouth daily.    Radiology:   No results found. CT abdomen pelvis with contrast 05/04/2020: Lower chest: Minimal dependent bibasilar atelectasis or scarring.  Hepatobiliary: No solid liver abnormality is seen. No gallstones,gallbladder wall thickening, or biliary dilatation.  Pancreas: Unremarkable. No pancreatic ductal dilatation orsurrounding inflammatory changes.  Spleen: Normal in size without significant abnormality.  Adrenals/Urinary Tract: Adrenal glands are unremarkable. Kidneys arenormal, without renal calculi, solid lesion, or hydronephrosis.Bladder is unremarkable.  Stomach/Bowel: Stomach is within normal limits. Appendix appearsnormal. No evidence of bowel wall thickening, distention, orinflammatory changes.  Vascular/Lymphatic: Aortic atherosclerosis. No enlarged abdominal or pelvic lymph nodes.  CT chest lung cancer screening 05/04/2020: 1. Lung-RADS 2S, benign appearance or behavior. Continue annual screening with low-dose chest CT without contrast in 12 months. 2. The "S" modifier above refers to potentially clinically significant non lung cancer related findings. Specifically, there is aortic atherosclerosis, in addition to left main and 2 vessel coronary artery disease. Please note that although the presence of coronary artery calcium documents the presence of coronary artery disease, the severity of this disease and any potential stenosis cannot be assessed on this non-gated CT examination. Assessment for potential risk factor modification, dietary therapy or pharmacologic  therapy may be warranted, if clinically indicated. 3. Mild diffuse bronchial wall thickening with very mild centrilobular and paraseptal emphysema; imaging findings suggestive of underlying COPD. Aortic Atherosclerosis (ICD10-I70.0) and Emphysema (ICD10-J43.9).  Cardiac Studies:   PCV ECHOCARDIOGRAM COMPLETE 06/15/2020 Left ventricle cavity is normal in size. Mild concentric hypertrophy of the left ventricle. Normal global wall motion. Normal LV systolic function with EF 55%. Doppler evidence of grade II (pseudonormal) diastolic dysfunction, elevated LAP. Left atrial cavity is severely dilated. Trileaflet aortic valve.  Moderate (Grade III) aortic regurgitation. Moderate (Grade II) mitral regurgitation. Mild to moderate tricuspid regurgitation. Estimated pulmonary artery systolic pressure 28 mmHg.   PCV MYOCARDIAL PERFUSION WO LEXISCAN 06/29/2020 1 Day Rest/Stress Protocol. Exercise time 6 minutes, achieved 7.23METS, and 86% of age predicted maximum heart rate. No convincing evidence of reversible myocardial ischemia or prior infarct. Left ventricular ejection fraction is 56% with normal wall motion. Low risk study. No prior studies for comparison.  EKG:   05/25/2020: Sinus rhythm at a rate of 82 bpm.  Biatrial enlargement.  Left axis, left anterior fascicular block.  Poor R wave progression, cannot exclude anteroseptal infarct old.  Nonspecific T wave abnormality.  Assessment     ICD-10-CM   1. Essential hypertension  E95 Basic metabolic panel    2. Atherosclerosis of native coronary artery of native heart without angina pectoris  I25.10 Lipid Panel With LDL/HDL Ratio    3. Nicotine dependence, cigarettes, uncomplicated  M84.132        Medications Discontinued During This Encounter  Medication Reason   metoprolol succinate (TOPROL XL) 25 MG 24 hr tablet     Meds ordered this encounter  Medications   metoprolol succinate (TOPROL XL) 50 MG 24 hr tablet    Sig: Take 1 tablet  (50 mg total) by mouth daily.    Dispense:  30 tablet    Refill:  3    Recommendations:   Wallice Granville is a 65 y.o. African-American male with history of hypertension, hyperlipidemia, and current pack per day smoker for the last 30 years.  Patient denies history of diabetes, MI, TIA/CVA, family history of premature CAD.  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   Patient presents for 80-monthfollow-up of hyperlipidemia, hypertension,  smoking cessation.  At last office visit added metoprolol succinate 25 mg daily.  Patient is tolerating addition of metoprolol without issue.  He recently underwent umbilical hernia repair for which she is recovering well, although he remains out of work due to lifting restrictions.  Overall patient is doing well from a cardiovascular standpoint without new symptomatology.  He is tolerating guideline directed medical therapy without issue.  Patient's blood pressure remains mildly elevated in the office today, we will therefore increase metoprolol succinate from 25 to 50 mg daily.  We will also obtain repeat lipid profile testing, as well as BNP as could consider addition of spironolactone in the future given hypertension and diastolic dysfunction.   Physical exam is unchanged compared to previous.  We will plan to continue surveillance of valvular disease with echocardiograms.  Patient does admit to high sodium diet, therefore counseled him regarding diet and lifestyle modifications.  Spent 5 minutes counseling patient regarding tobacco cessation.  Follow-up in 6 months, sooner if needed, for hypertension, hyperlipidemia, and valvular disease.   Alethia Berthold, PA-C 10/19/2020, 9:36 AM Office: [phone]

## 2020-10-19 NOTE — Patient Instructions (Signed)
Low-Sodium Eating Plan Sodium, which is an element that makes up salt, helps you maintain a healthy balance of fluids in your body. Too much sodium can increase your blood pressure and cause fluid and waste to be held in your body. Your health care provider or dietitian may recommend following this plan if you have high blood pressure (hypertension), kidney disease, liver disease, or heart failure. Eating less sodium can help lower your blood pressure, reduce swelling, and protect your heart, liver, and kidneys. What are tips for following this plan? Reading food labels The Nutrition Facts label lists the amount of sodium in one serving of the food. If you eat more than one serving, you must multiply the listed amount of sodium by the number of servings. Choose foods with less than 140 mg of sodium per serving. Avoid foods with 300 mg of sodium or more per serving. Shopping  Look for lower-sodium products, often labeled as "low-sodium" or "no salt added." Always check the sodium content, even if foods are labeled as "unsalted" or "no salt added." Buy fresh foods. Avoid canned foods and pre-made or frozen meals. Avoid canned, cured, or processed meats. Buy breads that have less than 80 mg of sodium per slice. Cooking  Eat more home-cooked food and less restaurant, buffet, and fast food. Avoid adding salt when cooking. Use salt-free seasonings or herbs instead of table salt or sea salt. Check with your health care provider or pharmacist before using salt substitutes. Cook with plant-based oils, such as canola, sunflower, or olive oil. Meal planning When eating at a restaurant, ask that your food be prepared with less salt or no salt, if possible. Avoid dishes labeled as brined, pickled, cured, smoked, or made with soy sauce, miso, or teriyaki sauce. Avoid foods that contain MSG (monosodium glutamate). MSG is sometimes added to Chinese food, bouillon, and some canned foods. Make meals that can  be grilled, baked, poached, roasted, or steamed. These are generally made with less sodium. General information Most people on this plan should limit their sodium intake to 1,500-2,000 mg (milligrams) of sodium each day. What foods should I eat? Fruits Fresh, frozen, or canned fruit. Fruit juice. Vegetables Fresh or frozen vegetables. "No salt added" canned vegetables. "No salt added" tomato sauce and paste. Low-sodium or reduced-sodium tomato and vegetable juice. Grains Low-sodium cereals, including oats, puffed wheat and rice, and shredded wheat. Low-sodium crackers. Unsalted rice. Unsalted pasta. Low-sodium bread. Whole-grain breads and whole-grain pasta. Meats and other proteins Fresh or frozen (no salt added) meat, poultry, seafood, and fish. Low-sodium canned tuna and salmon. Unsalted nuts. Dried peas, beans, and lentils without added salt. Unsalted canned beans. Eggs. Unsalted nut butters. Dairy Milk. Soy milk. Cheese that is naturally low in sodium, such as ricotta cheese, fresh mozzarella, or Swiss cheese. Low-sodium or reduced-sodium cheese. Cream cheese. Yogurt. Seasonings and condiments Fresh and dried herbs and spices. Salt-free seasonings. Low-sodium mustard and ketchup. Sodium-free salad dressing. Sodium-free light mayonnaise. Fresh or refrigerated horseradish. Lemon juice. Vinegar. Other foods Homemade, reduced-sodium, or low-sodium soups. Unsalted popcorn and pretzels. Low-salt or salt-free chips. The items listed above may not be a complete list of foods and beverages you can eat. Contact a dietitian for more information. What foods should I avoid? Vegetables Sauerkraut, pickled vegetables, and relishes. Olives. French fries. Onion rings. Regular canned vegetables (not low-sodium or reduced-sodium). Regular canned tomato sauce and paste (not low-sodium or reduced-sodium). Regular tomato and vegetable juice (not low-sodium or reduced-sodium). Frozen vegetables in  sauces. Grains   Instant hot cereals. Bread stuffing, pancake, and biscuit mixes. Croutons. Seasoned rice or pasta mixes. Noodle soup cups. Boxed or frozen macaroni and cheese. Regular salted crackers. Self-rising flour. Meats and other proteins Meat or fish that is salted, canned, smoked, spiced, or pickled. Precooked or cured meat, such as sausages or meat loaves. Bacon. Ham. Pepperoni. Hot dogs. Corned beef. Chipped beef. Salt pork. Jerky. Pickled herring. Anchovies and sardines. Regular canned tuna. Salted nuts. Dairy Processed cheese and cheese spreads. Hard cheeses. Cheese curds. Blue cheese. Feta cheese. String cheese. Regular cottage cheese. Buttermilk. Canned milk. Fats and oils Salted butter. Regular margarine. Ghee. Bacon fat. Seasonings and condiments Onion salt, garlic salt, seasoned salt, table salt, and sea salt. Canned and packaged gravies. Worcestershire sauce. Tartar sauce. Barbecue sauce. Teriyaki sauce. Soy sauce, including reduced-sodium. Steak sauce. Fish sauce. Oyster sauce. Cocktail sauce. Horseradish that you find on the shelf. Regular ketchup and mustard. Meat flavorings and tenderizers. Bouillon cubes. Hot sauce. Pre-made or packaged marinades. Pre-made or packaged taco seasonings. Relishes. Regular salad dressings. Salsa. Other foods Salted popcorn and pretzels. Corn chips and puffs. Potato and tortilla chips. Canned or dried soups. Pizza. Frozen entrees and pot pies. The items listed above may not be a complete list of foods and beverages you should avoid. Contact a dietitian for more information. Summary Eating less sodium can help lower your blood pressure, reduce swelling, and protect your heart, liver, and kidneys. Most people on this plan should limit their sodium intake to 1,500-2,000 mg (milligrams) of sodium each day. Canned, boxed, and frozen foods are high in sodium. Restaurant foods, fast foods, and pizza are also very high in sodium. You also get sodium by  adding salt to food. Try to cook at home, eat more fresh fruits and vegetables, and eat less fast food and canned, processed, or prepared foods. This information is not intended to replace advice given to you by your health care provider. Make sure you discuss any questions you have with your health care provider. Document Revised: 03/06/2019 Document Reviewed: 12/31/2018 Elsevier Patient Education  2022 [employer] Inc.  

## 2020-10-26 LAB — LIPID PANEL WITH LDL/HDL RATIO
Cholesterol, Total: 159 mg/dL (ref 100–199)
HDL: 47 mg/dL (ref >39)
LDL Chol Calc (NIH): 95 mg/dL (ref 0–99)
LDL/HDL Ratio: 2 ratio (ref 0.0–3.6)
Triglycerides: 93 mg/dL (ref 0–149)
VLDL Cholesterol Cal: 17 mg/dL (ref 5–40)

## 2020-10-26 LAB — BASIC METABOLIC PANEL
BUN/Creatinine Ratio: 7 — ABNORMAL LOW (ref 10–24)
BUN: 9 mg/dL (ref 8–27)
CO2: 24 mmol/L (ref 20–29)
Calcium: 9.5 mg/dL (ref 8.6–10.2)
Chloride: 102 mmol/L (ref 96–106)
Creatinine, Ser: 1.27 mg/dL (ref 0.76–1.27)
Glucose: 76 mg/dL (ref 65–99)
Potassium: 4.4 mmol/L (ref 3.5–5.2)
Sodium: 139 mmol/L (ref 134–144)
eGFR: 62 mL/min/{1.73_m2} (ref >59)

## 2020-11-04 NOTE — Progress Notes (Signed)
Called and spoke to pt, pt voiced understanding.

## 2021-04-17 NOTE — Progress Notes (Unsigned)
Primary Physician/Referring:  Aura Dials, MD Reason for referral: Abnormal chest CT Patient ID: Gregory Barajas, male    DOB: Feb 22, 1955, 66 y.o.   MRN: [id number]  No chief complaint on file.  HPI:    Gregory Barajas  is a 66 y.o. African-American male with history of hypertension, hyperlipidemia, and current pack per day smoker for the last 30 years.  Patient denies history of diabetes, MI, TIA/CVA, family history of premature CAD.  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  He was originally referred to our office by Dr. Sheryn Bison after incidental finding of coronary artery calcifications and aortic atherosclerosis on chest CT for lung cancer screening.  Patient subsequently underwent low risk stress test and echocardiogram revealing normal LVEF, hypertensive heart disease, and moderate MR and AR.  Patient presents for 68-monthfollow-up.  At last office visit increased Toprol-XL from 25 mg to 50 mg daily given uncontrolled hypertension.  Repeat BMP and lipid profile testing following last office visit showed stable renal function and lipids are well controlled. ***  *** Add sprio?   Patient presents for 68-monthollow-up of hyperlipidemia, hypertension, smoking cessation.  At last office visit added metoprolol succinate 25 mg daily.  Patient is tolerating addition of metoprolol without issue.  He recently underwent umbilical hernia repair for which she is recovering well.  Patient walks around his neighborhood regularly without issue, however does admit that it is not a strenuous walk.  He does not monitor his blood pressure at home.  Unfortunately patient continues to smoke 1/2-1 pack/day and drink 1-2 beers per night.  Patient remains asymptomatic, denies chest pain, palpitations, syncope, near syncope, dizziness, leg swelling, orthopnea, PND.    Past Medical History:  Diagnosis Date   High cholesterol    Hypertension    Past Surgical History:  Procedure  Laterality Date   BUNIONECTOMY     CATARACT EXTRACTION     Family History  Problem Relation Age of Onset   Cancer Brother     Social History   Tobacco Use   Smoking status: Every Day    Packs/day: 1.00    Types: Cigarettes   Smokeless tobacco: Never  Substance Use Topics   Alcohol use: Yes    Alcohol/week: 1.0 standard drink    Types: 1 Glasses of wine per week    Comment: OCC   Marital Status: Single   ROS  Review of Systems  Constitutional: Negative for malaise/fatigue and weight gain.  Cardiovascular:  Negative for chest pain, claudication, leg swelling, near-syncope, orthopnea, palpitations, paroxysmal nocturnal dyspnea and syncope.  Respiratory:  Negative for shortness of breath.   Neurological:  Negative for dizziness and weakness.   Objective  There were no vitals taken for this visit.  Vitals with BMI 10/19/2020 07/20/2020 07/20/2020  Height 5' 7"  - 5' 7"   Weight 174 lbs - 175 lbs 6 oz  BMI 2744.01 2702.72Systolic [id number] 63 60 69  Pulse 71 67 72  Some encounter information is confidential and restricted. Go to Review Flowsheets activity to see all data.      Physical Exam Vitals reviewed.  HENT:     Head: Normocephalic and atraumatic.  Cardiovascular:     Rate and Rhythm: Normal rate and regular rhythm.     Pulses: Intact distal pulses.          Carotid pulses are 2+ on the right side and 2+ on the left side.  Radial pulses are 2+ on the right side and 2+ on the left side.       Femoral pulses are 2+ on the right side and 2+ on the left side.      Popliteal pulses are 2+ on the right side and 2+ on the left side.       Dorsalis pedis pulses are 2+ on the right side and 2+ on the left side.       Posterior tibial pulses are 2+ on the right side and 2+ on the left side.     Heart sounds: S1 normal and S2 normal. No murmur heard.   No gallop.  Pulmonary:     Effort: Pulmonary effort is normal. No respiratory distress.     Breath sounds: No  wheezing, rhonchi or rales.  Musculoskeletal:     Right lower leg: No edema.     Left lower leg: No edema.  Skin:    General: Skin is warm and dry.  Neurological:     Mental Status: He is alert.    Laboratory examination:   Recent Labs    10/25/20 0936  NA 139  K 4.4  CL 102  CO2 24  GLUCOSE 76  BUN 9  CREATININE 1.27  CALCIUM 9.5   CrCl cannot be calculated (Patient's most recent lab result is older than the maximum 21 days allowed.).  CMP Latest Ref Rng & Units 10/25/2020 07/30/2007  Glucose 65 - 99 mg/dL 76 105(H)  BUN 8 - 27 mg/dL 9 10  Creatinine 0.76 - 1.27 mg/dL 1.27 1.12  Sodium 134 - 144 mmol/L 139 142  Potassium 3.5 - 5.2 mmol/L 4.4 4.2  Chloride 96 - 106 mmol/L 102 106  CO2 20 - 29 mmol/L 24 30  Calcium 8.6 - 10.2 mg/dL 9.5 9.6  Total Protein - - 7.3  Total Bilirubin - - 0.7  Alkaline Phos - - 65  AST - - 28  ALT - - 27   CBC 07/30/2007  WBC 6.5  Hemoglobin 15.0  Hematocrit 44.5  Platelets 307    Lipid Panel Recent Labs    10/25/20 0936  CHOL 159  TRIG 93  LDLCALC 95  HDL 47    HEMOGLOBIN A1C No results found for: HGBA1C, MPG TSH No results for input(s): TSH in the last 8760 hours.  External labs:   04/01/2020: Hemoglobin 15.2, hematocrit 46.1, MCV 85.9, platelet 285 Glucose 75, BUN 11, creatinine 1.24, sodium 140, potassium 4.2, alk phos 95, AST 23, ALT 22 Total cholesterol 178, HDL 56, triglycerides 93, LDL 105  Allergies  No Known Allergies  Medications Prior to Visit:   Outpatient Medications Prior to Visit  Medication Sig Dispense Refill   amLODipine (NORVASC) 10 MG tablet Take 1 tablet by mouth daily.     aspirin EC 81 MG tablet Take 1 tablet (81 mg total) by mouth daily. Swallow whole. 90 tablet 3   lisinopril (ZESTRIL) 20 MG tablet Take 1 tablet by mouth daily.     metoprolol succinate (TOPROL XL) 50 MG 24 hr tablet Take 1 tablet (50 mg total) by mouth daily. 30 tablet 3   rosuvastatin (CRESTOR) 20 MG tablet Take 20 mg by  mouth daily.     tadalafil (CIALIS) 20 MG tablet Take 20 mg by mouth as directed.     No facility-administered medications prior to visit.   Final Medications at End of Visit    No outpatient medications have been marked as taking for the 04/18/21  encounter Information systems manager) with Caitlin Hillmer C, PA-C.    Radiology:   No results found. CT abdomen pelvis with contrast 05/04/2020: Lower chest: Minimal dependent bibasilar atelectasis or scarring.  Hepatobiliary: No solid liver abnormality is seen. No gallstones,gallbladder wall thickening, or biliary dilatation.  Pancreas: Unremarkable. No pancreatic ductal dilatation orsurrounding inflammatory changes.  Spleen: Normal in size without significant abnormality.  Adrenals/Urinary Tract: Adrenal glands are unremarkable. Kidneys arenormal, without renal calculi, solid lesion, or hydronephrosis.Bladder is unremarkable.  Stomach/Bowel: Stomach is within normal limits. Appendix appearsnormal. No evidence of bowel wall thickening, distention, orinflammatory changes.  Vascular/Lymphatic: Aortic atherosclerosis. No enlarged abdominal or pelvic lymph nodes.  CT chest lung cancer screening 05/04/2020: 1. Lung-RADS 2S, benign appearance or behavior. Continue annual screening with low-dose chest CT without contrast in 12 months. 2. The "S" modifier above refers to potentially clinically significant non lung cancer related findings. Specifically, there is aortic atherosclerosis, in addition to left main and 2 vessel coronary artery disease. Please note that although the presence of coronary artery calcium documents the presence of coronary artery disease, the severity of this disease and any potential stenosis cannot be assessed on this non-gated CT examination. Assessment for potential risk factor modification, dietary therapy or pharmacologic therapy may be warranted, if clinically indicated. 3. Mild diffuse bronchial wall thickening with very mild  centrilobular and paraseptal emphysema; imaging findings suggestive of underlying COPD. Aortic Atherosclerosis (ICD10-I70.0) and Emphysema (ICD10-J43.9).  Cardiac Studies:   [facility]V ECHOCARDIOGRAM COMPLETE 06/15/2020 Left ventricle cavity is normal in size. Mild concentric hypertrophy of the left ventricle. Normal global wall motion. Normal LV systolic function with EF 55%. Doppler evidence of grade II (pseudonormal) diastolic dysfunction, elevated LAP. Left atrial cavity is severely dilated. Trileaflet aortic valve.  Moderate (Grade III) aortic regurgitation. Moderate (Grade II) mitral regurgitation. Mild to moderate tricuspid regurgitation. Estimated pulmonary artery systolic pressure 28 mmHg.   PCV MYOCARDIAL PERFUSION WO LEXISCAN 06/29/2020 1 Day Rest/Stress Protocol. Exercise time 6 minutes, achieved 7.23METS, and 86% of age predicted maximum heart rate. No convincing evidence of reversible myocardial ischemia or prior infarct. Left ventricular ejection fraction is 56% with normal wall motion. Low risk study. No prior studies for comparison.  EKG:  ***   05/25/2020: Sinus rhythm at a rate of 82 bpm.  Biatrial enlargement.  Left axis, left anterior fascicular block.  Poor R wave progression, cannot exclude anteroseptal infarct old.  Nonspecific T wave abnormality.  Assessment   No diagnosis found.    There are no discontinued medications.   No orders of the defined types were placed in this encounter.   Recommendations:   Akash Winski is a 66 y.o. African-American male with history of hypertension, hyperlipidemia, and current pack per day smoker for the last 30 years.  Patient denies history of diabetes, MI, TIA/CVA, family history of premature CAD.  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   Patient presents for 84-monthfollow-up.  At last office visit increased Toprol-XL from 25 mg to 50 mg daily given uncontrolled hypertension.  Repeat BMP and lipid  profile testing following last office visit showed stable renal function and lipids are well controlled. ***  *** Add sprio?   Patient presents for 325-monthollow-up of hyperlipidemia, hypertension, smoking cessation.  At last office visit added metoprolol succinate 25 mg daily.  Patient is tolerating addition of metoprolol without issue.  He recently underwent umbilical hernia repair for which she is recovering well, although he remains out of work due to lifting restrictions.  Overall patient is doing well from a cardiovascular standpoint without new symptomatology.  He is tolerating guideline directed medical therapy without issue.  Patient's blood pressure remains mildly elevated in the office today, we will therefore increase metoprolol succinate from 25 to 50 mg daily.  We will also obtain repeat lipid profile testing, as well as BNP as could consider addition of spironolactone in the future given hypertension and diastolic dysfunction.   Physical exam is unchanged compared to previous.  We will plan to continue surveillance of valvular disease with echocardiograms.  Patient does admit to high sodium diet, therefore counseled him regarding diet and lifestyle modifications.  Spent 5 minutes counseling patient regarding tobacco cessation.  Follow-up in 6 months, sooner if needed, for hypertension, hyperlipidemia, and valvular disease.   Alethia Berthold, PA-C 04/17/2021, 2:49 PM Office: [phone]

## 2021-04-18 ENCOUNTER — Ambulatory Visit: Payer: 59 | Admitting: Student

## 2021-04-18 DIAGNOSIS — I351 Nonrheumatic aortic (valve) insufficiency: Secondary | ICD-10-CM

## 2021-04-18 DIAGNOSIS — I2510 Atherosclerotic heart disease of native coronary artery without angina pectoris: Secondary | ICD-10-CM

## 2021-04-18 DIAGNOSIS — I340 Nonrheumatic mitral (valve) insufficiency: Secondary | ICD-10-CM

## 2021-04-18 DIAGNOSIS — I10 Essential (primary) hypertension: Secondary | ICD-10-CM

## 2022-02-18 IMAGING — CT CT ABD-PELV W/ CM
1 of 3 series · 14 of 32 positions shown, 19 images · IV contrast (APPLIED)
Comparison: None.

CLINICAL DATA: Periumbilical pain postprandial for 3 months

EXAM:
CT ABDOMEN AND PELVIS WITH CONTRAST
TECHNIQUE: Multidetector CT imaging of the abdomen and pelvis was performed
using the standard protocol following bolus administration of
intravenous contrast.
CONTRAST:  100mL Z8MP9B-HCC IOPAMIDOL (Z8MP9B-HCC) INJECTION 61%,
additional oral enteric contrast

[Series 2: abd/pelvis w/cm · axial · 0.68mm/px · z∈[-573,-213]mm · 14 of 84 slices shown, 19 images]
[im 6/84  soft-tissue]
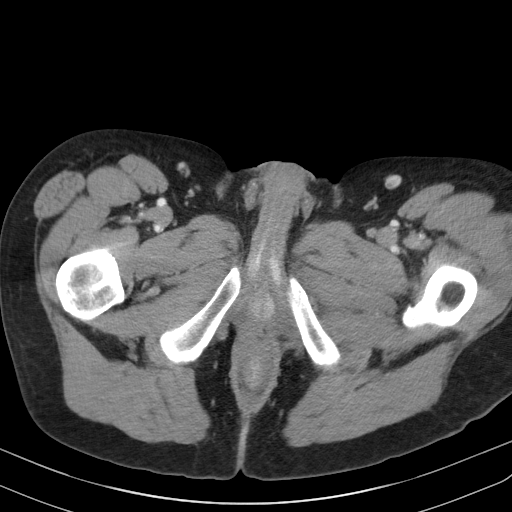
[im 6/84  bone]
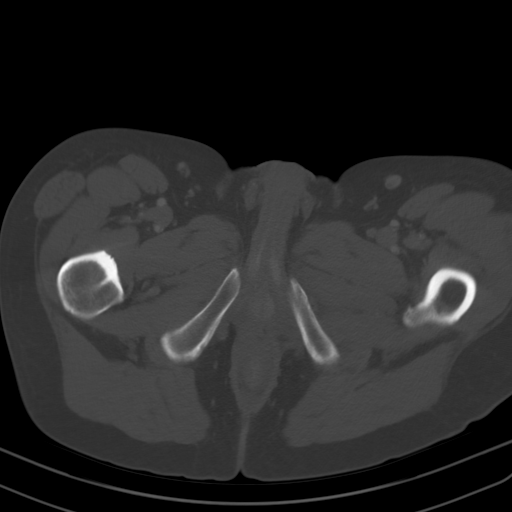
[im 11/84  soft-tissue]
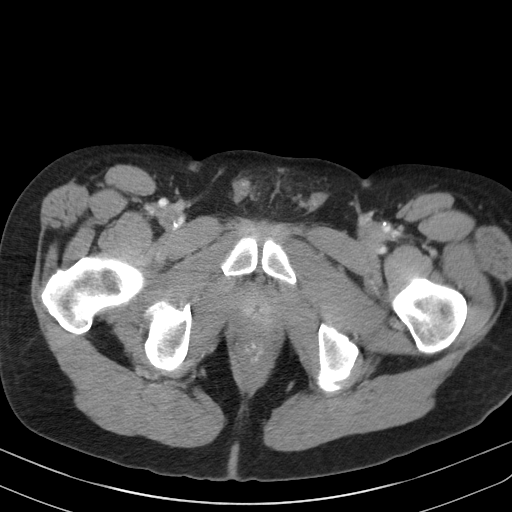
[im 16/84  soft-tissue]
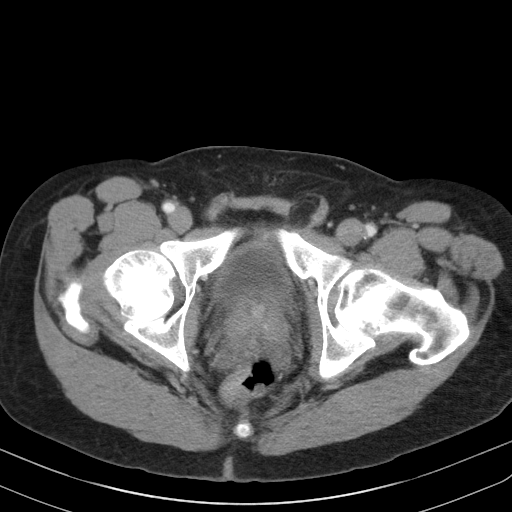
[im 26/84  soft-tissue]
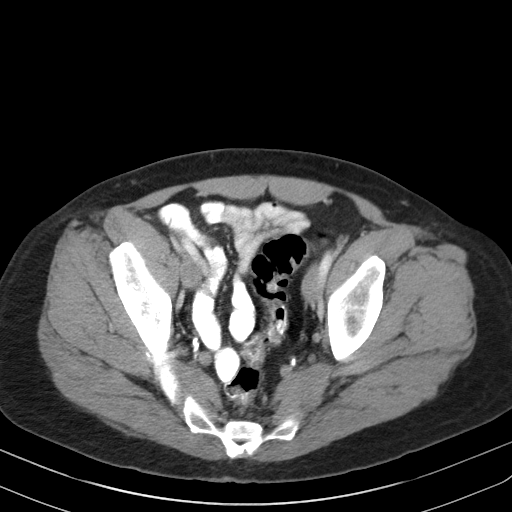
[im 32/84  soft-tissue]
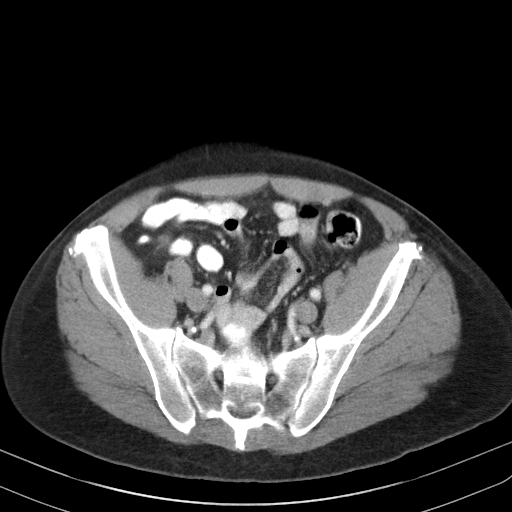
[im 37/84  soft-tissue]
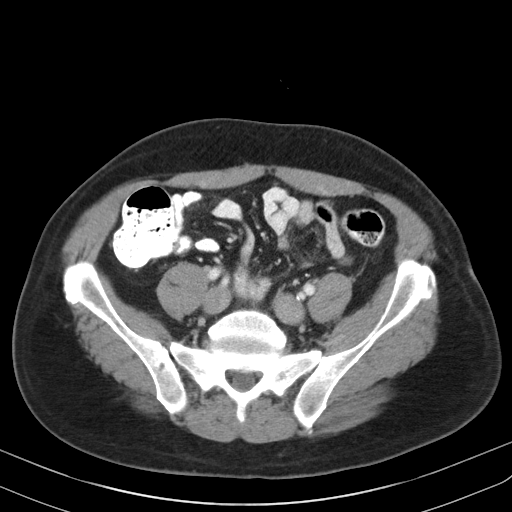
[im 42/84  soft-tissue]
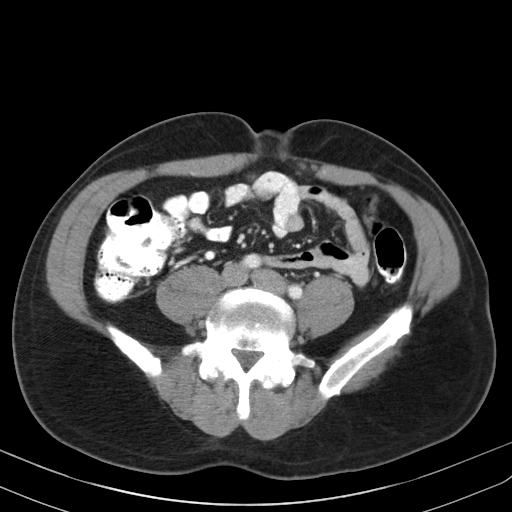
[im 47/84  soft-tissue]
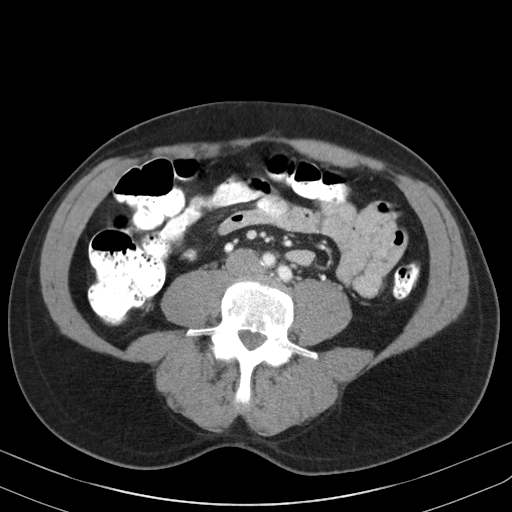
[im 52/84  soft-tissue]
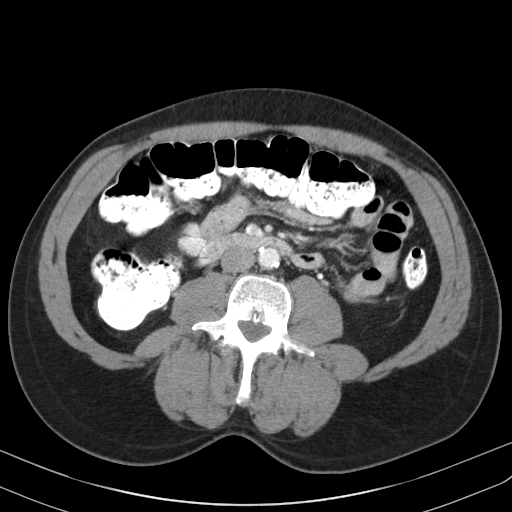
[im 52/84  bone]
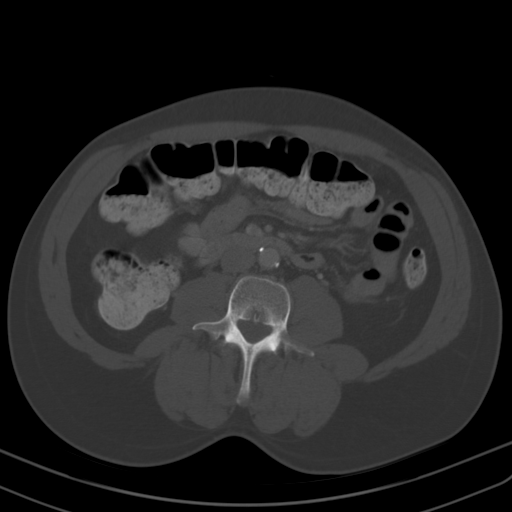
[im 58/84  soft-tissue]
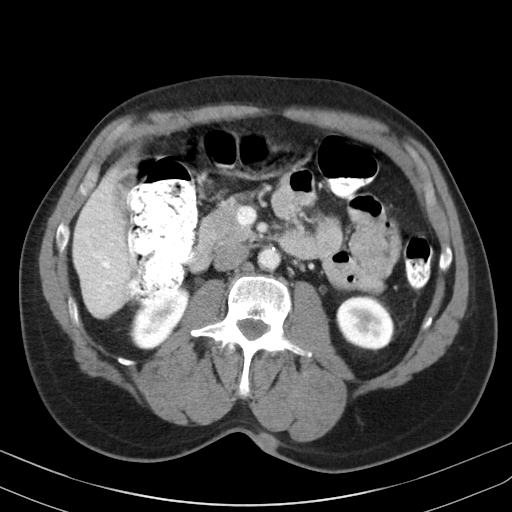
[im 63/84  lung]
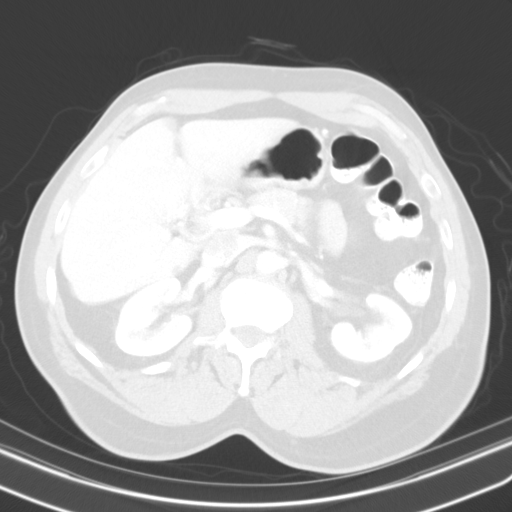
[im 68/84  soft-tissue]
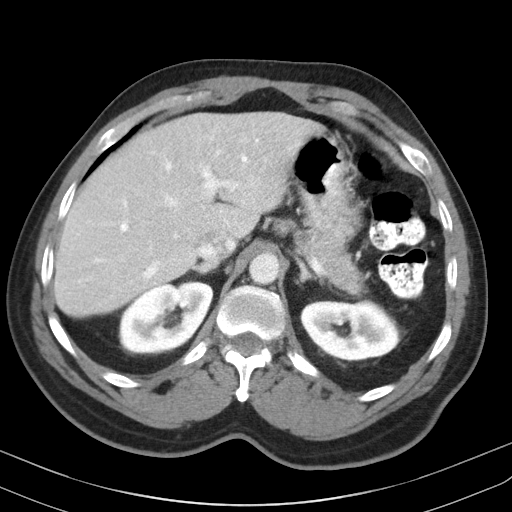
[im 68/84  lung]
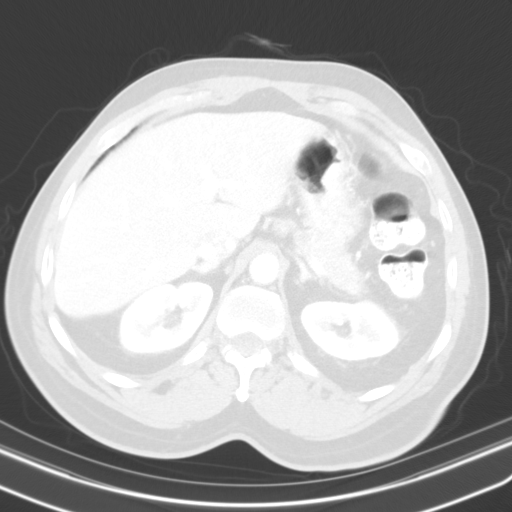
[im 73/84  soft-tissue]
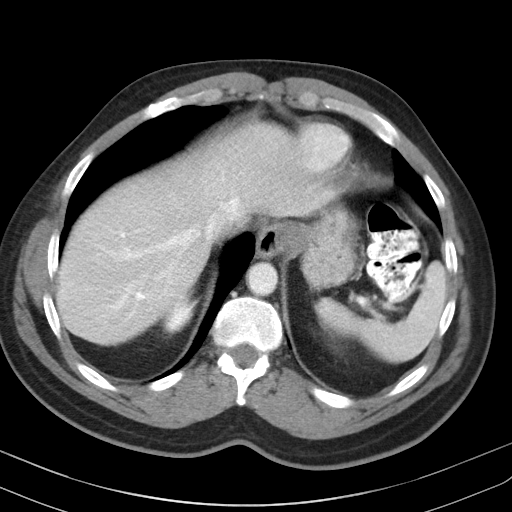
[im 73/84  lung]
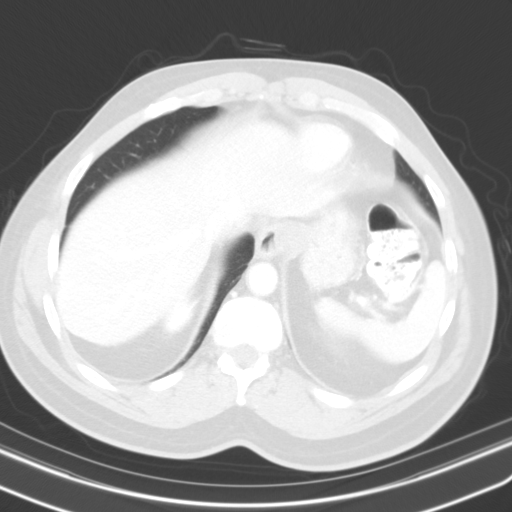
[im 78/84  soft-tissue]
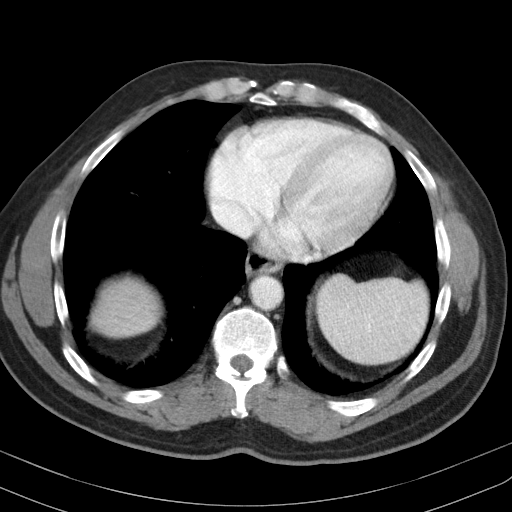
[im 78/84  lung]
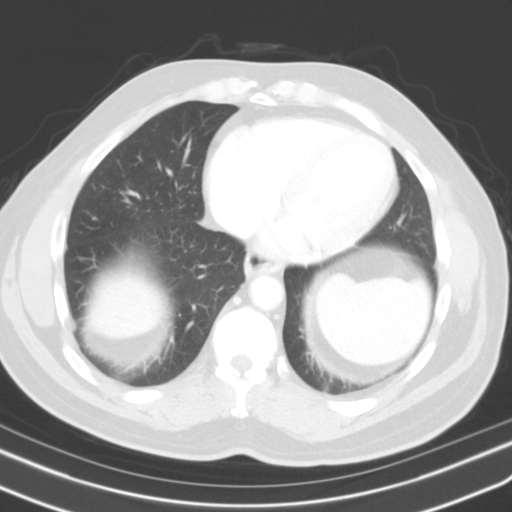

[14 of 32 positions shown; findings below may reference images not displayed]

FINDINGS: Lower chest: Minimal dependent bibasilar atelectasis or scarring.

Hepatobiliary: No solid liver abnormality is seen. No gallstones,
gallbladder wall thickening, or biliary dilatation.

Pancreas: Unremarkable. No pancreatic ductal dilatation or
surrounding inflammatory changes.

Spleen: Normal in size without significant abnormality.

Adrenals/Urinary Tract: Adrenal glands are unremarkable. Kidneys are
normal, without renal calculi, solid lesion, or hydronephrosis.
Bladder is unremarkable.

Stomach/Bowel: Stomach is within normal limits. Appendix appears
normal. No evidence of bowel wall thickening, distention, or
inflammatory changes.

Vascular/Lymphatic: Aortic atherosclerosis. No enlarged abdominal or
pelvic lymph nodes.

Reproductive: Prostatomegaly.

Other: Broad based, fat containing umbilical hernia measuring 3.0 cm
(series 2, image 42). No abdominopelvic ascites.

Musculoskeletal: No acute or significant osseous findings.
IMPRESSION: 1. No acute CT findings of the abdomen or pelvis to explain
periumbilical pain.
2. Fat containing umbilical hernia.
3. Prostatomegaly.

Aortic Atherosclerosis ([id number]).

## 2022-05-04 DIAGNOSIS — Z23 Encounter for immunization: Secondary | ICD-10-CM | POA: Diagnosis not present

## 2022-05-04 DIAGNOSIS — Z0000 Encounter for general adult medical examination without abnormal findings: Secondary | ICD-10-CM | POA: Diagnosis not present

## 2022-05-04 DIAGNOSIS — J432 Centrilobular emphysema: Secondary | ICD-10-CM | POA: Diagnosis not present

## 2022-05-04 DIAGNOSIS — I2510 Atherosclerotic heart disease of native coronary artery without angina pectoris: Secondary | ICD-10-CM | POA: Diagnosis not present

## 2022-05-04 DIAGNOSIS — Z720 Tobacco use: Secondary | ICD-10-CM | POA: Diagnosis not present

## 2022-05-04 DIAGNOSIS — R972 Elevated prostate specific antigen [PSA]: Secondary | ICD-10-CM | POA: Diagnosis not present

## 2022-05-04 DIAGNOSIS — E559 Vitamin D deficiency, unspecified: Secondary | ICD-10-CM | POA: Diagnosis not present

## 2022-05-04 DIAGNOSIS — E785 Hyperlipidemia, unspecified: Secondary | ICD-10-CM | POA: Diagnosis not present

## 2022-05-04 DIAGNOSIS — I38 Endocarditis, valve unspecified: Secondary | ICD-10-CM | POA: Diagnosis not present

## 2022-05-04 DIAGNOSIS — I10 Essential (primary) hypertension: Secondary | ICD-10-CM | POA: Diagnosis not present

## 2022-05-04 DIAGNOSIS — R0989 Other specified symptoms and signs involving the circulatory and respiratory systems: Secondary | ICD-10-CM | POA: Diagnosis not present

## 2022-05-04 DIAGNOSIS — I2584 Coronary atherosclerosis due to calcified coronary lesion: Secondary | ICD-10-CM | POA: Diagnosis not present

## 2022-05-09 ENCOUNTER — Other Ambulatory Visit: Payer: Self-pay | Admitting: Family Medicine

## 2022-05-09 DIAGNOSIS — F17210 Nicotine dependence, cigarettes, uncomplicated: Secondary | ICD-10-CM

## 2022-05-09 DIAGNOSIS — R0989 Other specified symptoms and signs involving the circulatory and respiratory systems: Secondary | ICD-10-CM

## 2022-05-12 ENCOUNTER — Encounter: Payer: Self-pay | Admitting: Family Medicine

## 2022-06-11 ENCOUNTER — Ambulatory Visit
Admission: RE | Admit: 2022-06-11 | Discharge: 2022-06-11 | Disposition: A | Discharge status: Home / Self Care | Payer: No Typology Code available for payment source | Source: Ambulatory Visit | Attending: Family Medicine | Admitting: Family Medicine

## 2022-06-11 DIAGNOSIS — R0989 Other specified symptoms and signs involving the circulatory and respiratory systems: Secondary | ICD-10-CM

## 2022-06-11 DIAGNOSIS — I779 Disorder of arteries and arterioles, unspecified: Secondary | ICD-10-CM | POA: Diagnosis not present

## 2022-06-12 ENCOUNTER — Other Ambulatory Visit: Payer: 59

## 2022-06-12 ENCOUNTER — Other Ambulatory Visit: Payer: Self-pay

## 2022-06-12 ENCOUNTER — Ambulatory Visit
Admission: RE | Admit: 2022-06-12 | Discharge: 2022-06-12 | Disposition: A | Discharge status: Home / Self Care | Payer: No Typology Code available for payment source | Source: Ambulatory Visit | Attending: Family Medicine | Admitting: Family Medicine

## 2022-06-12 ENCOUNTER — Other Ambulatory Visit: Payer: Self-pay | Admitting: Family Medicine

## 2022-06-12 DIAGNOSIS — F17210 Nicotine dependence, cigarettes, uncomplicated: Secondary | ICD-10-CM

## 2022-06-12 DIAGNOSIS — F17200 Nicotine dependence, unspecified, uncomplicated: Secondary | ICD-10-CM | POA: Diagnosis not present

## 2022-06-12 DIAGNOSIS — R0989 Other specified symptoms and signs involving the circulatory and respiratory systems: Secondary | ICD-10-CM

## 2022-06-12 DIAGNOSIS — Z122 Encounter for screening for malignant neoplasm of respiratory organs: Secondary | ICD-10-CM | POA: Diagnosis not present

## 2022-06-13 ENCOUNTER — Other Ambulatory Visit: Payer: No Typology Code available for payment source

## 2022-07-03 ENCOUNTER — Encounter: Payer: Self-pay | Admitting: Cardiology

## 2022-07-03 ENCOUNTER — Ambulatory Visit: Payer: No Typology Code available for payment source | Admitting: Cardiology

## 2022-07-03 VITALS — BP 145/73 | HR 70 | Resp 16 | Ht 67.0 in | Wt 168.0 lb

## 2022-07-03 DIAGNOSIS — I6523 Occlusion and stenosis of bilateral carotid arteries: Secondary | ICD-10-CM

## 2022-07-03 DIAGNOSIS — F17210 Nicotine dependence, cigarettes, uncomplicated: Secondary | ICD-10-CM | POA: Diagnosis not present

## 2022-07-03 DIAGNOSIS — I340 Nonrheumatic mitral (valve) insufficiency: Secondary | ICD-10-CM | POA: Diagnosis not present

## 2022-07-03 DIAGNOSIS — I10 Essential (primary) hypertension: Secondary | ICD-10-CM | POA: Diagnosis not present

## 2022-07-03 DIAGNOSIS — I2510 Atherosclerotic heart disease of native coronary artery without angina pectoris: Secondary | ICD-10-CM | POA: Diagnosis not present

## 2022-07-03 DIAGNOSIS — I351 Nonrheumatic aortic (valve) insufficiency: Secondary | ICD-10-CM

## 2022-07-03 DIAGNOSIS — I700 Atherosclerosis of aorta: Secondary | ICD-10-CM | POA: Diagnosis not present

## 2022-07-03 MED ORDER — ASPIRIN 81 MG PO TBEC
81.0000 mg | DELAYED_RELEASE_TABLET | Freq: Every day | ORAL | 12 refills | Status: AC
Start: 2022-07-03 — End: ?

## 2022-07-03 NOTE — Progress Notes (Signed)
ID:  Gregory Barajas, DOB October 07, 1955, MRN [id number]  PCP:  Henrine Screws, MD  Cardiologist:  Tessa Lerner, DO, [facility] (established care Jul 03, 2022) Former Cardiology Providers: Elvin So, PA  Date: 07/03/22 Last Office Visit: 10/19/2020  Chief Complaint  Patient presents with   Coronary Artery Disease   Follow-up    HPI  Gregory Barajas is a 67 y.o. African-American male whose past medical history and cardiovascular risk factors include: Hypertension, hyperlipidemia, cigarette smoker, moderate MR/AR, bilateral carotid artery stenosis.   Patient was referred to the practice for evaluation of coronary artery calcification and aortic atherosclerosis by PCP.  Patient was originally seen by Elvin So, PA and I am seeing him for the first time today.  Patient originally underwent CT of the chest for lung cancer screening given his history of smoking and was noted to have CAC and aortic atherosclerosis.  Since then he did undergo an echo and stress test results reviewed and noted below.  He presents today for 2-year follow-up visit.  Since last office visit he has not had any anginal chest pain or heart failure symptoms.  He recently had labs with PCP in March or 2024.  His PCP increased his statin from 20 mg to 40 mg p.o. daily and has repeat labs in the coming months.  He recently retired from city of [place] in January 2024.  And he is motivated to improve his modifiable cardiovascular risk factors.  He is walking at least 30 minutes a day for 2 days a week..  Office blood pressures are not at goal.  He does not check his blood pressures at home.  But is compliant with his medical therapy.  FUNCTIONAL STATUS: Walks 30 minutes 2 days a week  ALLERGIES: No Known Allergies  MEDICATION LIST PRIOR TO VISIT: Current Meds  Medication Sig   amLODipine (NORVASC) 10 MG tablet Take 1 tablet by mouth daily.   aspirin EC 81 MG tablet Take 1 tablet (81 mg total) by mouth  daily. Swallow whole.   lisinopril (ZESTRIL) 20 MG tablet Take 1 tablet by mouth daily.   Rosuvastatin Calcium 40 MG CPSP Take 40 mg by mouth daily.   tadalafil (CIALIS) 20 MG tablet Take 20 mg by mouth as directed.     PAST MEDICAL HISTORY: Past Medical History:  Diagnosis Date   Aortic atherosclerosis (HCC)    Carotid stenosis    Coronary atherosclerosis due to calcified coronary lesion    High cholesterol    Hypertension     PAST SURGICAL HISTORY: Past Surgical History:  Procedure Laterality Date   BUNIONECTOMY     CATARACT EXTRACTION      FAMILY HISTORY: The patient family history includes Cancer in his brother.  SOCIAL HISTORY:  The patient  reports that he has been smoking cigarettes. He has been smoking an average of 1 pack per day. He has never used smokeless tobacco. He reports current alcohol use of about 1.0 standard drink of alcohol per week. He reports that he does not use drugs.  REVIEW OF SYSTEMS: Review of Systems  Cardiovascular:  Negative for chest pain, claudication, dyspnea on exertion, irregular heartbeat, leg swelling, near-syncope, orthopnea, palpitations, paroxysmal nocturnal dyspnea and syncope.  Respiratory:  Negative for shortness of breath.   Hematologic/Lymphatic: Negative for bleeding problem.  Musculoskeletal:  Negative for muscle cramps and myalgias.  Neurological:  Negative for dizziness and light-headedness.    PHYSICAL EXAM:    07/03/2022   11:33 AM 10/19/2020  9:03 AM 07/20/2020   11:00 AM  Vitals with BMI  Height 5\' 7"  5\' 7"    Weight 168 lbs 174 lbs   BMI 26.31 27.25   Systolic 145 140 161  Diastolic 73 63 60  Pulse 70 71 67    Physical Exam  Constitutional: No distress.  Age appropriate, hemodynamically stable.   Neck: No JVD present.  Cardiovascular: Normal rate, regular rhythm, S1 normal, S2 normal and intact distal pulses. Exam reveals no gallop, no S3 and no S4.  Murmur heard. Holosystolic murmur is present with a grade  of 3/6 at the apex. Pulses:      Carotid pulses are  on the right side with bruit.      Dorsalis pedis pulses are 1+ on the right side and 2+ on the left side.       Posterior tibial pulses are 1+ on the right side and 2+ on the left side.  Pulmonary/Chest: Effort normal and breath sounds normal. No stridor. He has no wheezes. He has no rales.  Abdominal: Soft. Bowel sounds are normal. He exhibits no distension. There is no abdominal tenderness.  Musculoskeletal:        General: No edema.     Cervical back: Neck supple.  Neurological: He is alert and oriented to person, place, and time. He has intact cranial nerves (2-12).  Skin: Skin is warm and moist.   CARDIAC DATABASE: EKG: Jul 03, 2022: Sinus rhythm, 66 bpm, LAE, without underlying ischemia or injury pattern.  Echocardiogram: PCV ECHOCARDIOGRAM COMPLETE 06/15/2020  Narrative Echocardiogram 06/15/2020: Left ventricle cavity is normal in size. Mild concentric hypertrophy of the left ventricle. Normal global wall motion. Normal LV systolic function with EF 55%. Doppler evidence of grade II (pseudonormal) diastolic dysfunction, elevated LAP. Left atrial cavity is severely dilated. Trileaflet aortic valve.  Moderate (Grade III) aortic regurgitation. Moderate (Grade II) mitral regurgitation. Mild to moderate tricuspid regurgitation. Estimated pulmonary artery systolic pressure 28 mmHg.    Stress Testing: PCV MYOCARDIAL PERFUSION WO LEXISCAN 06/29/2020  Narrative Exercise Myoview stress test 06/29/2020: 1 Day Rest/Stress Protocol. Exercise time 6 minutes, achieved 7.23METS, and 86% of age predicted maximum heart rate. No convincing evidence of reversible myocardial ischemia or prior infarct. Left ventricular ejection fraction is 56% with normal wall motion. Low risk study. No prior studies for comparison.   Heart Catheterization: None  Carotid duplex Jun 13, 2022 IMPRESSION: Right:   Heterogeneous and partially calcified  plaque at the right carotid bifurcation, with discordant results regarding degree of stenosis by established duplex criteria. Peak velocity suggests 70%-99% stenosis, with the ICA/ CCA ratio suggesting a lesser degree of stenosis. If establishing a more accurate degree of stenosis is required, cerebral angiogram should be considered, or as a second best test, CTA.   Left:   Heterogeneous and partially calcified plaque at the left carotid bifurcation, with discordant results regarding degree of stenosis by established duplex criteria. Peak velocity suggests 50%-69% stenosis, with the ICA/ CCA ratio suggesting a lesser degree of stenosis. If establishing a more accurate degree of stenosis is required, cerebral angiogram should be considered, or as a second best test, CTA.   LABORATORY DATA:    07/30/2007   12:25 PM  CBC  WBC 6.5   Hemoglobin 15.0   Hematocrit 44.5   Platelets 307        Latest Ref Rng & Units 10/25/2020    9:36 AM 07/30/2007   12:25 PM  CMP  Glucose 65 - 99 mg/dL 76  105   BUN 8 - 27 mg/dL 9  10   Creatinine 1.61 - 1.27 mg/dL 0.96  0.45   Sodium 409 - 144 mmol/L 139  142   Potassium 3.5 - 5.2 mmol/L 4.4  4.2   Chloride 96 - 106 mmol/L 102  106   CO2 20 - 29 mmol/L 24  30   Calcium 8.6 - 10.2 mg/dL 9.5  9.6   Total Protein   7.3   Total Bilirubin   0.7   Alkaline Phos   65   AST   28   ALT   27     Lipid Panel     Component Value Date/Time   CHOL 159 10/25/2020 0936   TRIG 93 10/25/2020 0936   HDL 47 10/25/2020 0936   CHOLHDL 3.7 07/30/2007 1225   VLDL 21 07/30/2007 1225   LDLCALC 95 10/25/2020 0936   LABVLDL 17 10/25/2020 0936    No components found for: "NTPROBNP" No results for input(s): "PROBNP" in the last 8760 hours. No results for input(s): "TSH" in the last 8760 hours.  BMP No results for input(s): "NA", "K", "CL", "CO2", "GLUCOSE", "BUN", "CREATININE", "CALCIUM", "GFRNONAA", "GFRAA" in the last 8760 hours.  HEMOGLOBIN A1C No  results found for: "HGBA1C", "MPG"  Lipid Panel w/reflex Reviewed date:05/07/2022 09:26:17 AM Interpretation: Performing Lab: Notes/Report: Testing Performed at: [facility], [address], [address], [place], [place] [address]  Cholesterol 166 <200 mg/dL    CHOL/HDL 3.0 4.7-8.2 Ratio    HDLD 56 30-70 mg/dL Values below 40 mg/dL indicate increased risk factor  Triglyceride 76 0-199 mg/dL    NHDL 956 2-130 mg/dL Range dependent upon risk factors.  LDL Chol Calc (NIH) 95 0-99 mg/dL    Vitamin D 86(VH) Total Reviewed date:05/07/2022 09:24:51 AM Interpretation: Performing Lab: Notes/Report: Testing Performed at: [facility], [address], [place], [place] [address]  25(OH) Vit D, Total 15.5 30.0-100.0 ng/mL    PSA Medicare Reviewed date:05/07/2022 02:21:45 PM Interpretation: Performing Lab: Notes/Report: Testing Performed at: [facility], [address], [place], [place] [address]  PSA-M 2.35 0.01-4.00 ng/mL PSA Performed by [employer]  Values obtained with different assay methods or kits cannot be used interchangeably. Results cannot be interpreted as absolute evidence of the presence or absence of malignant disease.   Comp Metabolic Panel Reviewed date:05/07/2022 02:21:28 PM Interpretation: Performing Lab: Notes/Report: Testing Performed at: [facility], [address], [address], [place], [place] [address]  Glucose 81 70-99 mg/dL    BUN 13 4-01 mg/dL    Creatinine 0.27 2.53-6.64 mg/dl    [id number] 64 >95 calc In accordance with recommendations from NKF-ASN Task Force, [facility] has updated its eGFR calc to the 2021 CKD-EDI equation that estimates kidney function without a race variable;Stage 1 > 90 ML/Min plus Albuminuria;Stage 2 60-89 ML/MIN;Stage 3 30-59 ML/MIN;Stage 4 15-29 ML/MIN;Stage 5 <15 ML/MIN  Sodium 139 136-145 mmol/L    Potassium 4.1 3.5-5.5 mmol/L    Chloride 104 98-107 mmol/L    CO2 28 22-32 mmol/L    Anion Gap 11.0 6.0-20.0 mmol/L     Calcium 9.8 8.6-10.3 mg/dL    CA-corrected 6.38 7.56-43.32 mg/dL    Protein, Total 8.2 9.5-1.8 g/dL    Albumin 4.4 8.4-1.6 g/dL    TBIL 0.4 6.0-6.3 mg/dL    ALP 83 01-601 U/L    AST 20 0-39 U/L    ALT 15 0-52 U/L    CBC with Diff Reviewed date:05/07/2022 02:21:10 PM Interpretation: Performing  Lab: Notes/Report: Testing Performed at: [facility], [address], [address], [place], [place] [address]  WBC 7.4 4.0-11.0 K/ul    RBC 5.28 4.20-5.80 M/uL    HGB 14.8 13.0-17.0 g/dL    HCT 60.4 54.0-98.1 %    MCV 84.7 80.0-94.0 fL    MCH 28.0 27.0-33.0 pg    MCHC 33.0 32.0-36.0 g/dL    RDW 19.1 47.8-29.5 %    PLT 302 150-400 K/uL    MPV 8.1 7.5-10.7 fL    NE% 62.6 43.3-71.9 %    LY% 25.6 16.8-43.5 %    MO% 9.8 4.6-12.4 %    EO% 1.5 0.0-7.8 %    BA% 0.5 0.0-1.0 %    NE# 4.6 1.9-7.2 K/uL    LY# 1.90 1.10-2.70 K/uL    MO# 0.7 0.3-0.8 K/uL    EO# 0.1 0.0-0.6 K/uL    BA# 0.0 0.0-0.1 K/uL    NRBC% 0.10      NRBC# 0.01       IMPRESSION:    ICD-10-CM   1. Atherosclerosis of native coronary artery of native heart without angina pectoris  I25.10 EKG 12-Lead    aspirin EC 81 MG tablet    2. Atherosclerosis of aorta (HCC)  I70.0 aspirin EC 81 MG tablet    3. Essential hypertension  I10     4. Moderate mitral regurgitation  I34.0 PCV ECHOCARDIOGRAM COMPLETE    5. Moderate aortic regurgitation  I35.1 PCV ECHOCARDIOGRAM COMPLETE    6. Bilateral carotid artery stenosis  I65.23     7. Cigarette smoker  F17.210        RECOMMENDATIONS: Kuron Garth is a 67 y.o. African-American male whose past medical history and cardiac risk factors include: Hypertension, hyperlipidemia, cigarette smoker, moderate MR/AR, bilateral carotid artery stenosis.   Atherosclerosis of native coronary artery of native heart without angina pectoris Atherosclerosis of aorta (HCC) Denies anginal chest pain or heart failure symptoms. EKG nonischemic. Functional capacity remains stable. Statin therapy  recently increased by PCP as noted above. Start aspirin 81 mg p.o. daily. No additional testing warranted at this time as he is asymptomatic. I have asked him to increase physical activity to 30 minutes a day 5 days a week as tolerated.  Essential hypertension Office blood pressures are not at goal. I have asked him to check his blood pressures at home. Continue current antihypertensive medications. Currently managed by primary care provider.  Moderate mitral regurgitation Moderate aortic regurgitation Clinically remains asymptomatic. Should improve with better blood pressure management. Plan echo to reevaluate the progression of valvular heart disease  Bilateral carotid artery stenosis Asymptomatic. Based on duplex criteria bilateral disease greater than 50% Has an appointment with vascular and vein in June 2024-arranged by PCP.  Encouraged him to follow-up Start aspirin 81 mg p.o. daily as discussed above. Continue statin therapy. Reemphasized importance of complete smoking cessation. Patient is asked to be more cognizant of symptoms of visual changes or focal neurological deficits.  Cigarette smoker 1 pack/day and at times more Recommend ultrasound of the abdomen to screen for AAA-will defer to PCP Denies claudication but due to decreased distal pulses recommend ankle-brachial index to screen for PAD -will defer to be PCP Tobacco cessation counseling: Patient was informed of the dangers of tobacco abuse including stroke, cancer, and MI, as well as benefits of tobacco cessation. Patient is willing to quit at this time. 7 mins were spent counseling patient cessation techniques. We discussed various methods to help quit smoking, including deciding on a date  to quit, joining a support group, pharmacological agents- nicotine gum/patch/lozenges.  I will reassess his progress at the next follow-up visit  FINAL MEDICATION LIST END OF ENCOUNTER: Meds ordered this encounter   Medications   aspirin EC 81 MG tablet    Sig: Take 1 tablet (81 mg total) by mouth daily. Swallow whole.    Dispense:  30 tablet    Refill:  12    Medications Discontinued During This Encounter  Medication Reason   aspirin EC 81 MG tablet    metoprolol succinate (TOPROL XL) 50 MG 24 hr tablet      Current Outpatient Medications:    amLODipine (NORVASC) 10 MG tablet, Take 1 tablet by mouth daily., Disp: , Rfl:    aspirin EC 81 MG tablet, Take 1 tablet (81 mg total) by mouth daily. Swallow whole., Disp: 30 tablet, Rfl: 12   lisinopril (ZESTRIL) 20 MG tablet, Take 1 tablet by mouth daily., Disp: , Rfl:    Rosuvastatin Calcium 40 MG CPSP, Take 40 mg by mouth daily., Disp: , Rfl:    tadalafil (CIALIS) 20 MG tablet, Take 20 mg by mouth as directed., Disp: , Rfl:   Orders Placed This Encounter  Procedures   EKG 12-Lead   PCV ECHOCARDIOGRAM COMPLETE    There are no Patient Instructions on file for this visit.   --Continue cardiac medications as reconciled in final medication list. --Return in about 6 months (around 01/03/2023) for Follow up, Coronary artery calcification, MR/AR, review echo results. or sooner if needed. --Continue follow-up with your primary care physician regarding the management of your other chronic comorbid conditions.  Patient's questions and concerns were addressed to his satisfaction. He voices understanding of the instructions provided during this encounter.   This note was created using a voice recognition software as a result there may be grammatical errors inadvertently enclosed that do not reflect the nature of this encounter. Every attempt is made to correct such errors.  Tessa Lerner, [place], [facility]  Pager:  [phone] Office: [phone]

## 2022-07-06 ENCOUNTER — Other Ambulatory Visit: Payer: Self-pay | Admitting: *Deleted

## 2022-07-06 DIAGNOSIS — I6529 Occlusion and stenosis of unspecified carotid artery: Secondary | ICD-10-CM

## 2022-07-17 ENCOUNTER — Ambulatory Visit (INDEPENDENT_AMBULATORY_CARE_PROVIDER_SITE_OTHER): Payer: No Typology Code available for payment source | Admitting: Vascular Surgery

## 2022-07-17 ENCOUNTER — Ambulatory Visit (HOSPITAL_COMMUNITY)
Admission: RE | Admit: 2022-07-17 | Discharge: 2022-07-17 | Disposition: A | Discharge status: Home / Self Care | Payer: No Typology Code available for payment source | Source: Ambulatory Visit | Attending: Vascular Surgery | Admitting: Vascular Surgery

## 2022-07-17 ENCOUNTER — Encounter: Payer: Self-pay | Admitting: Vascular Surgery

## 2022-07-17 VITALS — BP 154/80 | HR 67 | Temp 97.8°F | Resp 16 | Ht 68.0 in | Wt 167.0 lb

## 2022-07-17 DIAGNOSIS — I6523 Occlusion and stenosis of bilateral carotid arteries: Secondary | ICD-10-CM

## 2022-07-17 DIAGNOSIS — I6529 Occlusion and stenosis of unspecified carotid artery: Secondary | ICD-10-CM | POA: Diagnosis not present

## 2022-07-17 DIAGNOSIS — I779 Disorder of arteries and arterioles, unspecified: Secondary | ICD-10-CM | POA: Insufficient documentation

## 2022-07-17 NOTE — Progress Notes (Signed)
Patient name: Gregory Barajas MRN: [id number] DOB: Sep 24, 1955 Sex: male  REASON FOR CONSULT: Carotid stenosis  HPI: Gregory Barajas is a 67 y.o. male, hx carotid stenosis, HTN, HLD that presents for evaluation of his carotid artery disease.  He has no history of strokes or TIAs.  He states his carotid disease was discovered by his PCP.  He denies any history of neck radiation.  He does smoke 1 pack/day.  He had carotid ultrasound at [facility] imaging showing concern for 70 to 99% stenosis on the right ICA by peak systolic velocity but lesser degree of stenosis by ICA/CCA ratio.  Similarly he had a peak systolic velocity suggesting 50 to 69% on the left ICA but lesser degree of stenosis by ICA/CCA ratio.  Past Medical History:  Diagnosis Date   Aortic atherosclerosis (HCC)    Carotid stenosis    Coronary atherosclerosis due to calcified coronary lesion    High cholesterol    Hypertension     Past Surgical History:  Procedure Laterality Date   BUNIONECTOMY     CATARACT EXTRACTION      Family History  Problem Relation Age of Onset   Cancer Brother     SOCIAL HISTORY: Social History   Socioeconomic History   Marital status: Single    Spouse name: Not on file   Number of children: 2   Years of education: Not on file   Highest education level: Not on file  Occupational History   Not on file  Tobacco Use   Smoking status: Every Day    Packs/day: 1    Types: Cigarettes   Smokeless tobacco: Never  Vaping Use   Vaping Use: Never used  Substance and Sexual Activity   Alcohol use: Yes    Alcohol/week: 1.0 standard drink of alcohol    Types: 1 Glasses of wine per week    Comment: OCC   Drug use: No   Sexual activity: Not on file  Other Topics Concern   Not on file  Social History Narrative   Not on file   Social Determinants of Health   Financial Resource Strain: Not on file  Food Insecurity: Not on file  Transportation Needs: Not on file  Physical Activity: Not on  file  Stress: Not on file  Social Connections: Not on file  Intimate Partner Violence: Not on file    No Known Allergies  Current Outpatient Medications  Medication Sig Dispense Refill   amLODipine (NORVASC) 10 MG tablet Take 1 tablet by mouth daily.     aspirin EC 81 MG tablet Take 1 tablet (81 mg total) by mouth daily. Swallow whole. 30 tablet 12   lisinopril (ZESTRIL) 20 MG tablet Take 1 tablet by mouth daily.     Rosuvastatin Calcium 40 MG CPSP Take 40 mg by mouth daily.     tadalafil (CIALIS) 20 MG tablet Take 20 mg by mouth as directed.     No current facility-administered medications for this visit.    REVIEW OF SYSTEMS:  [X]  denotes positive finding, [ ]  denotes negative finding Cardiac  Comments:  Chest pain or chest pressure:    Shortness of breath upon exertion:    Short of breath when lying flat:    Irregular heart rhythm:        Vascular    Pain in calf, thigh, or hip brought on by ambulation:    Pain in feet at night that wakes you up from your sleep:  Blood clot in your veins:    Leg swelling:         Pulmonary    Oxygen at home:    Productive cough:     Wheezing:         Neurologic    Sudden weakness in arms or legs:     Sudden numbness in arms or legs:     Sudden onset of difficulty speaking or slurred speech:    Temporary loss of vision in one eye:     Problems with dizziness:         Gastrointestinal    Blood in stool:     Vomited blood:         Genitourinary    Burning when urinating:     Blood in urine:        Psychiatric    Major depression:         Hematologic    Bleeding problems:    Problems with blood clotting too easily:        Skin    Rashes or ulcers:        Constitutional    Fever or chills:      PHYSICAL EXAM: Vitals:   07/17/22 1212 07/17/22 1214  BP: (!) 144/74 (!) 154/80  Pulse: 60 67  Resp: 16   Temp: 97.8 F (36.6 C)   TempSrc: Temporal   SpO2: 100%   Weight: 167 lb (75.8 kg)   Height: 5\' 8"  (1.727 m)      GENERAL: The patient is a well-nourished male, in no acute distress. The vital signs are documented above. CARDIAC: There is a regular rate and rhythm.  VASCULAR:  Bilateral radial pulses palpable Bilateral femoral pulses palpable Bilateral DP pulses palpable PULMONARY: No respiratory distress. ABDOMEN: Soft and non-tender. MUSCULOSKELETAL: There are no major deformities or cyanosis. NEUROLOGIC: No focal weakness or paresthesias are detected.  CN II-XII grossly intact.   PSYCHIATRIC: The patient has a normal affect.  DATA:   Carotid duplex in our office showed 60 to 79% right ICA stenosis and a 40 to 59% left ICA stenosis  Assessment/Plan:  67 year old male presents for evaluation of asymptomatic carotid artery disease.  He had studies at [facility] imaging suggesting a 70-99% right ICA stenosis by peak systolic velocity but to a lesser degree by ICA/CCA ratio.  We repeated his ultrasound here in our office and by our velocity criteria his right ICA stenosis is 60-79%.  This is asymptomatic carotid disease.  He does not need any surgical intervention at this time.  I discussed surgical indication is indicated for greater than 80% stenosis for asymptomatic disease.  He is on aspirin statin for risk reduction.  I talked about smoking cessation.  I will see him in 6 months with repeat duplex.  He also has a 40 to 59% left ICA stenosis that we will also follow.   Cephus Shelling, MD [facility] Office: [phone]

## 2022-07-19 ENCOUNTER — Other Ambulatory Visit: Payer: Self-pay

## 2022-07-19 DIAGNOSIS — I6523 Occlusion and stenosis of bilateral carotid arteries: Secondary | ICD-10-CM

## 2022-08-02 ENCOUNTER — Other Ambulatory Visit: Payer: No Typology Code available for payment source

## 2022-08-30 ENCOUNTER — Other Ambulatory Visit: Payer: No Typology Code available for payment source

## 2022-12-28 ENCOUNTER — Ambulatory Visit: Payer: Self-pay | Admitting: Cardiology

## 2023-01-04 ENCOUNTER — Ambulatory Visit: Payer: No Typology Code available for payment source | Attending: Cardiology | Admitting: Cardiology

## 2023-01-04 ENCOUNTER — Encounter: Payer: Self-pay | Admitting: Cardiology

## 2023-01-04 VITALS — BP 144/70 | HR 76 | Resp 16 | Ht 68.0 in | Wt 174.0 lb

## 2023-01-04 DIAGNOSIS — I700 Atherosclerosis of aorta: Secondary | ICD-10-CM

## 2023-01-04 DIAGNOSIS — I6523 Occlusion and stenosis of bilateral carotid arteries: Secondary | ICD-10-CM

## 2023-01-04 DIAGNOSIS — I351 Nonrheumatic aortic (valve) insufficiency: Secondary | ICD-10-CM | POA: Diagnosis not present

## 2023-01-04 DIAGNOSIS — F17210 Nicotine dependence, cigarettes, uncomplicated: Secondary | ICD-10-CM | POA: Diagnosis not present

## 2023-01-04 DIAGNOSIS — I340 Nonrheumatic mitral (valve) insufficiency: Secondary | ICD-10-CM

## 2023-01-04 DIAGNOSIS — I2510 Atherosclerotic heart disease of native coronary artery without angina pectoris: Secondary | ICD-10-CM | POA: Diagnosis not present

## 2023-01-04 MED ORDER — LISINOPRIL 40 MG PO TABS
40.0000 mg | ORAL_TABLET | Freq: Every morning | ORAL | 3 refills | Status: AC
Start: 2023-01-04 — End: ?

## 2023-01-04 NOTE — Progress Notes (Signed)
Cardiology Office Note:  .   Date:  01/04/2023  ID:  Gregory Barajas, DOB 01/29/1956, MRN [id number] PCP:  Gregory Screws, MD  Former Cardiology Providers: Gregory So, PA [facility] Providers Cardiologist:  Gregory Lerner, DO , [facility] (established care 07/03/2022) Electrophysiologist:  None  Click to update primary MD,subspecialty MD or APP then REFRESH:1}    Chief Complaint  Patient presents with   Atherosclerosis of native coronary artery of native heart w   Follow-up    History of Present Illness: .   Gregory Barajas is a 67 y.o. African-American male whose past medical history and cardiovascular risk factors includes: Hypertension, hyperlipidemia, cigarette smoker, moderate MR/AR, bilateral carotid artery stenosis.  Patient was referred to the practice for evaluation of coronary artery atherosclerosis as well as aortic atherosclerosis by PCP.  Patient had a lung cancer screening CT which noted CAC and aortic atherosclerosis and since then has undergone appropriate ischemic workup.  In the past statin therapy was increased by PCP and sensitivity.  From city of [place] in January 2024 he was motivated to start increasing physical activity and living a healthier lifestyle.  In the interim patient had a carotid duplex which noted concerns for severe stenosis and he was referred to vascular surgery.  They repeated a carotid duplex and he was noted to have less than 80% stenosis in the right ICA and less than 60% stenosis in the left ICA.  Since he is asymptomatic follow-up is recommended at this time.  He presents today for 47-month follow-up visit.  Over the last 6 months he denies any anginal chest pain or heart failure symptoms.  Unfortunately, he continues to smoke 1 pack/day.  Review of Systems: .   Review of Systems  Cardiovascular:  Positive for dyspnea on exertion (chronic - walking on incline). Negative for chest pain, claudication, irregular heartbeat, leg  swelling, near-syncope, orthopnea, palpitations, paroxysmal nocturnal dyspnea and syncope.  Respiratory:  Negative for shortness of breath.   Hematologic/Lymphatic: Negative for bleeding problem.  Musculoskeletal:  Negative for muscle cramps and myalgias.  Neurological:  Negative for dizziness and light-headedness.    Studies Reviewed:   EKG: EKG Interpretation Date/Time:  Friday January 04 2023 09:06:39 EST Text Interpretation: Normal sinus rhythm Normal ECG No previous ECGs available Confirmed by Gregory Barajas ([id number]) on 01/04/2023 9:15:23 AM  Echocardiogram: May 2022: LVEF 55%. Grade 2 diastolic dysfunction. Elevated LAP. Severely dilated left atrium. Moderate AR and moderate MR. Mild to moderate TR and RVSP 20 mmHg.   Stress Testing: Exercise Myoview stress test 06/29/2020: Low risk study  Carotid duplex: June 2024 Right Carotid: Velocities in the right ICA are consistent with a 60-79%  stenosis.   Left Carotid: Velocities in the left ICA are consistent with a 40-59% stenosis.   Vertebrals: Bilateral vertebral arteries demonstrate antegrade flow.  Subclavians: Normal flow hemodynamics were seen in bilateral subclavian arteries.   RADIOLOGY: NA  Risk Assessment/Calculations:   NA   Labs:   Lipid panels: September 2022: Total cholesterol 159, triglycerides 93, HDL 47, LDL calculated 95 March 2024: Total cholesterol 166, triglycerides 76, HDL 56, LDL 95  Physical Exam:    Today's Vitals   01/04/23 0902  BP: (!) 144/70  Pulse: 76  Resp: 16  SpO2: 95%  Weight: 174 lb (78.9 kg)  Height: 5\' 8"  (1.727 m)   Body mass index is 26.46 kg/m. Wt Readings from Last 3 Encounters:  01/04/23 174 lb (78.9 kg)  07/17/22 167 lb (75.8 kg)  07/03/22 168  lb (76.2 kg)    Physical Exam  Constitutional: No distress.  Age appropriate, hemodynamically stable.   Neck: No JVD present.  Cardiovascular: Normal rate, regular rhythm, S1 normal, S2 normal and intact distal  pulses. Exam reveals no gallop, no S3 and no S4.  Murmur heard. Holosystolic murmur is present with a grade of 3/6 at the apex. Pulses:      Carotid pulses are  on the right side with bruit.      Dorsalis pedis pulses are 1+ on the right side and 2+ on the left side.       Posterior tibial pulses are 1+ on the right side and 2+ on the left side.  Pulmonary/Chest: Effort normal and breath sounds normal. No stridor. He has no wheezes. He has no rales.  Abdominal: Soft. Bowel sounds are normal. He exhibits no distension. There is no abdominal tenderness.  Musculoskeletal:        General: No edema.     Cervical back: Neck supple.  Neurological: He is alert and oriented to person, place, and time. He has intact cranial nerves (2-12).  Skin: Skin is warm and moist.   Impression & Recommendation(s):  Impression:   ICD-10-CM   1. Atherosclerosis of native coronary artery of native heart without angina pectoris  I25.10 EKG 12-Lead    lisinopril (ZESTRIL) 40 MG tablet    Basic metabolic panel    Basic metabolic panel    2. Atherosclerosis of aorta (HCC)  I70.0 lisinopril (ZESTRIL) 40 MG tablet    Basic metabolic panel    Basic metabolic panel    3. Moderate mitral regurgitation  I34.0 ECHOCARDIOGRAM COMPLETE    4. Moderate aortic regurgitation  I35.1 ECHOCARDIOGRAM COMPLETE    5. Bilateral carotid artery stenosis  I65.23     6. Cigarette smoker  F17.210        Recommendation(s):  Atherosclerosis of native coronary artery of native heart without angina pectoris Atherosclerosis of aorta (HCC) Denies anginal chest pain or heart failure symptoms since last office encounter. Has undergone appropriate ischemic workup as outlined above. Continue aspirin and statin therapy. No additional workup warranted at this time as he is asymptomatic.  Moderate mitral regurgitation Moderate aortic regurgitation Noted on prior echocardiogram -clinically asymptomatic Home blood pressures are between  135-140 mmHg on current medical therapy. Recommend up titration of antihypertensive medications given his valvular heart disease. Currently takes all of his medications in the morning. Recommend lisinopril 20 mg p.o. daily to be changed to 40 mg p.o. every morning.  BMP in one week to check renal function and electrolytes. Recommend taking his current dose of amlodipine 10 mg p.o. every afternoon. Recommend a goal SBP around 130 mmHg. He was supposed to have a repeat echocardiogram prior to today's office visit which is still pending. Will reschedule the echo in 6 months.   Bilateral carotid artery stenosis Was referred to vascular surgery due to concerns for the need of carotid artery intervention. Patient had a repeat carotid duplex at their facility results noted above. Since he is asymptomatic this shared decision was to improve his modifiable cardiovascular risk factors and to monitor his disease clinically.  Will defer further management to vascular surgery at this time. Continue dual antiplatelet therapy. Continue rosuvastatin 40 mg p.o. nightly  Cigarette smoker Tobacco cessation counseling: Currently smoking 1 packs/day   He is informed of the dangers of tobacco abuse including stroke, cancer, and MI, as well as benefits of tobacco cessation. He is willing to  quit at this time. 5 mins were spent counseling patient cessation techniques. We discussed various methods to help quit smoking, including deciding on a date to quit, joining a support group, pharmacological agents- nicotine gum/patch/lozenges.  I will reassess his progress at the next follow-up visit  Patient states that he has his yearly well visit with PCP in March 2025-will have repeat labs including lipids and will send [place] a copy for reference.  I have scheduled him an echocardiogram in 6 months to reevaluate the progression of valvular heart disease, if relatively stable we will continue surveillance.  However if there  is significant change I will see him sooner.  Otherwise 1 year follow-up visit recommended  Orders Placed:  Orders Placed This Encounter  Procedures   Basic metabolic panel    Standing Status:   Future    Number of Occurrences:   1    Standing Expiration Date:   01/04/2024   EKG 12-Lead   ECHOCARDIOGRAM COMPLETE    Standing Status:   Future    Standing Expiration Date:   01/04/2024    Order Specific Question:   Where should this test be performed    Answer:   [facility])    Order Specific Question:   Does the patient weigh less than or greater than 250 lbs?    Answer:   Patient weighs less than 250 lbs    Order Specific Question:   Perflutren DEFINITY (image enhancing agent) should be administered unless hypersensitivity or allergy exist    Answer:   Administer Perflutren    Order Specific Question:   Reason for exam-Echo    Answer:   Other-Full Diagnosis List    Order Specific Question:   Full ICD-10/Reason for Exam    Answer:   Mitral valve regurgitation [201049]    Order Specific Question:   Full ICD-10/Reason for Exam    Answer:   Aortic valve regurgitation [190850]    As part of medical decision making carotid duplex results from June 2024, progress note from vascular surgery in June 2024 were reviewed during today's encounter  Final Medication List:    Meds ordered this encounter  Medications   lisinopril (ZESTRIL) 40 MG tablet    Sig: Take 1 tablet (40 mg total) by mouth in the morning.    Dispense:  90 tablet    Refill:  3    Increasing dose to 40 mg in the AM    Medications Discontinued During This Encounter  Medication Reason   lisinopril (ZESTRIL) 20 MG tablet Dose change     Current Outpatient Medications:    amLODipine (NORVASC) 10 MG tablet, Take 1 tablet by mouth every evening., Disp: , Rfl:    aspirin EC 81 MG tablet, Take 1 tablet (81 mg total) by mouth daily. Swallow whole., Disp: 30 tablet, Rfl: 12   lisinopril (ZESTRIL) 40 MG  tablet, Take 1 tablet (40 mg total) by mouth in the morning., Disp: 90 tablet, Rfl: 3   Rosuvastatin Calcium 40 MG CPSP, Take 40 mg by mouth daily., Disp: , Rfl:    tadalafil (CIALIS) 20 MG tablet, Take 20 mg by mouth as directed., Disp: , Rfl:   Consent:   N/A  Disposition:   1 year follow-up, sooner if needed Patient may be asked to follow-up sooner based on the results of the above-mentioned testing.  His questions and concerns were addressed to his satisfaction. He voices understanding of the recommendations provided during this encounter.  Signed, Gregory Lerner, DO, [facility]  [facility]  [address] [place], [place] [address] 01/04/2023 10:31 AM

## 2023-01-04 NOTE — Patient Instructions (Addendum)
Medication Instructions:  Your physician has recommended you make the following change in your medication:   INCREASE Lisinopril to 40 mg once daily in the MORNING  **Take Amlodipine 10 mg once daily in the EVENING**   *If you need a refill on your cardiac medications before your next appointment, please call your pharmacy*  Lab Work: BMP in 1 week  If you have labs (blood work) drawn today and your tests are completely normal, you will receive your results only by: MyChart Message (if you have MyChart) OR A paper copy in the mail If you have any lab test that is abnormal or we need to change your treatment, we will call you to review the results.  Testing/Procedures: Your physician has requested that you have an echocardiogram in 6 months. Echocardiography is a painless test that uses sound waves to create images of your heart. It provides your doctor with information about the size and shape of your heart and how well your heart's chambers and valves are working. This procedure takes approximately one hour. There are no restrictions for this procedure. Please do NOT wear cologne, perfume, aftershave, or lotions (deodorant is allowed). Please arrive 15 minutes prior to your appointment time.  Please note: We ask at that you not bring children with you during ultrasound (echo/ vascular) testing. Due to room size and safety concerns, children are not allowed in the ultrasound rooms during exams. Our front office staff cannot provide observation of children in our lobby area while testing is being conducted. An adult accompanying a patient to their appointment will only be allowed in the ultrasound room at the discretion of the ultrasound technician under special circumstances. We apologize for any inconvenience.   Follow-Up: At [facility], you and your health needs are our priority.  As part of our continuing mission to provide you with exceptional heart care, we have created designated  Provider Care Teams.  These Care Teams include your primary Cardiologist (physician) and Advanced Practice Providers (APPs -  Physician Assistants and Nurse Practitioners) who all work together to provide you with the care you need, when you need it.  We recommend signing up for the patient portal called "MyChart".  Sign up information is provided on this After Visit Summary.  MyChart is used to connect with patients for Virtual Visits (Telemedicine).  Patients are able to view lab/test results, encounter notes, upcoming appointments, etc.  Non-urgent messages can be sent to your provider as well.   To learn more about what you can do with MyChart, go to [email or web address].    Your next appointment:   1 year(s)  The format for your next appointment:   In Person  Provider:   Tessa Lerner, DO {

## 2023-01-15 ENCOUNTER — Ambulatory Visit: Payer: No Typology Code available for payment source | Admitting: Vascular Surgery

## 2023-01-15 ENCOUNTER — Encounter (HOSPITAL_COMMUNITY): Payer: No Typology Code available for payment source

## 2023-01-15 DIAGNOSIS — E785 Hyperlipidemia, unspecified: Secondary | ICD-10-CM | POA: Diagnosis not present

## 2023-01-15 DIAGNOSIS — E663 Overweight: Secondary | ICD-10-CM | POA: Diagnosis not present

## 2023-01-15 DIAGNOSIS — F17211 Nicotine dependence, cigarettes, in remission: Secondary | ICD-10-CM | POA: Diagnosis not present

## 2023-01-15 DIAGNOSIS — I10 Essential (primary) hypertension: Secondary | ICD-10-CM | POA: Diagnosis not present

## 2023-01-15 DIAGNOSIS — Z6826 Body mass index (BMI) 26.0-26.9, adult: Secondary | ICD-10-CM | POA: Diagnosis not present

## 2023-01-15 DIAGNOSIS — Z008 Encounter for other general examination: Secondary | ICD-10-CM | POA: Diagnosis not present

## 2023-02-11 NOTE — Progress Notes (Signed)
 Patient name: Gregory Barajas MRN: [id number] DOB: Jul 04, 1955 Sex: male  REASON FOR CONSULT: 6 month follow-up, carotid stenosis  HPI: Gregory Barajas is a 67 y.o. male, hx carotid stenosis, HTN, HLD that presents for 6 month follow-up for surveillance of his carotid artery disease.  He has no history of strokes or TIAs.  He had carotid ultrasound at [facility] imaging showing concern for 70 to 99% stenosis on the right ICA by peak systolic velocity but lesser degree of stenosis by ICA/CCA ratio.  Similarly he had a peak systolic velocity suggesting 50 to 69% on the left ICA but lesser degree of stenosis by ICA/CCA ratio.  Duplex here showed 60 to 79% right ICA stenosis and 40 to 59% left ICA stenosis.  He reports no stroke or TIA symptoms over the last 6 months.  States he quit smoking 1 month ago.  Still taking his aspirin  statin.  No new concerns today.    Past Medical History:  Diagnosis Date   Aortic atherosclerosis (HCC)    Carotid stenosis    Coronary atherosclerosis due to calcified coronary lesion    High cholesterol    Hypertension     Past Surgical History:  Procedure Laterality Date   BUNIONECTOMY     CATARACT EXTRACTION      Family History  Problem Relation Age of Onset   Cancer Brother     SOCIAL HISTORY: Social History   Socioeconomic History   Marital status: Single    Spouse name: Not on file   Number of children: 2   Years of education: Not on file   Highest education level: Not on file  Occupational History   Not on file  Tobacco Use   Smoking status: Every Day    Current packs/day: 1.00    Types: Cigarettes   Smokeless tobacco: Never  Vaping Use   Vaping status: Never Used  Substance and Sexual Activity   Alcohol use: Yes    Alcohol/week: 1.0 standard drink of alcohol    Types: 1 Glasses of wine per week    Comment: OCC   Drug use: No   Sexual activity: Not on file  Other Topics Concern   Not on file  Social History Narrative   Not on  file   Social Drivers of Health   Financial Resource Strain: Not on file  Food Insecurity: Not on file  Transportation Needs: Not on file  Physical Activity: Not on file  Stress: Not on file  Social Connections: Not on file  Intimate Partner Violence: Not on file    No Known Allergies  Current Outpatient Medications  Medication Sig Dispense Refill   amLODipine (NORVASC) 10 MG tablet Take 1 tablet by mouth every evening.     aspirin  EC 81 MG tablet Take 1 tablet (81 mg total) by mouth daily. Swallow whole. 30 tablet 12   lisinopril  (ZESTRIL ) 40 MG tablet Take 1 tablet (40 mg total) by mouth in the morning. 90 tablet 3   Rosuvastatin Calcium 40 MG CPSP Take 40 mg by mouth daily.     tadalafil (CIALIS) 20 MG tablet Take 20 mg by mouth as directed.     No current facility-administered medications for this visit.    REVIEW OF SYSTEMS:  [X]  denotes positive finding, [ ]  denotes negative finding Cardiac  Comments:  Chest pain or chest pressure:    Shortness of breath upon exertion:    Short of breath when lying flat:    Irregular heart  rhythm:        Vascular    Pain in calf, thigh, or hip brought on by ambulation:    Pain in feet at night that wakes you up from your sleep:     Blood clot in your veins:    Leg swelling:         Pulmonary    Oxygen at home:    Productive cough:     Wheezing:         Neurologic    Sudden weakness in arms or legs:     Sudden numbness in arms or legs:     Sudden onset of difficulty speaking or slurred speech:    Temporary loss of vision in one eye:     Problems with dizziness:         Gastrointestinal    Blood in stool:     Vomited blood:         Genitourinary    Burning when urinating:     Blood in urine:        Psychiatric    Major depression:         Hematologic    Bleeding problems:    Problems with blood clotting too easily:        Skin    Rashes or ulcers:        Constitutional    Fever or chills:      PHYSICAL  EXAM: There were no vitals filed for this visit.   GENERAL: The patient is a well-nourished male, in no acute distress. The vital signs are documented above. CARDIAC: There is a regular rate and rhythm.  VASCULAR:  Bilateral radial pulses palpable PULMONARY: No respiratory distress. ABDOMEN: Soft and non-tender. MUSCULOSKELETAL: There are no major deformities or cyanosis. NEUROLOGIC: No focal weakness or paresthesias are detected.  CN II-XII grossly intact.   PSYCHIATRIC: The patient has a normal affect.  DATA:   Carotid duplex in our office today again showed 60 to 79% right ICA stenosis and a 40 to 59% left ICA stenosis  Assessment/Plan:  67 year old male presents for 35-month follow-up for his carotid artery disease.  Previously seen with a 60 to 79% right ICA stenosis and a 40 to 59% left ICA stenosis.  His carotid artery disease remains asymptomatic and again has a 60 to 79% right ICA stenosis and a 40 to 59% left ICA stenosis by velocity criteria.  I discussed no indication for surgical intervention.  I discussed for asymptomatic carotid disease recommend surgery for greater than 80% stenosis.  I will have him follow-up with me again in 6 months with repeat carotid ultrasound.  Discussed he remain on aspirin  statin for risk reduction.  Lonni DOROTHA Gaskins, MD [facility] [place] Office: [phone]

## 2023-02-12 ENCOUNTER — Encounter: Payer: Self-pay | Admitting: Vascular Surgery

## 2023-02-12 ENCOUNTER — Ambulatory Visit (INDEPENDENT_AMBULATORY_CARE_PROVIDER_SITE_OTHER): Payer: No Typology Code available for payment source | Admitting: Vascular Surgery

## 2023-02-12 ENCOUNTER — Ambulatory Visit (HOSPITAL_COMMUNITY)
Admission: RE | Admit: 2023-02-12 | Discharge: 2023-02-12 | Disposition: A | Discharge status: Home / Self Care | Payer: No Typology Code available for payment source | Source: Ambulatory Visit | Attending: Vascular Surgery | Admitting: Vascular Surgery

## 2023-02-12 VITALS — BP 135/74 | HR 76 | Temp 97.8°F | Resp 18 | Ht 68.0 in | Wt 173.1 lb

## 2023-02-12 DIAGNOSIS — I6523 Occlusion and stenosis of bilateral carotid arteries: Secondary | ICD-10-CM

## 2023-02-26 ENCOUNTER — Other Ambulatory Visit: Payer: Self-pay

## 2023-02-26 DIAGNOSIS — I6523 Occlusion and stenosis of bilateral carotid arteries: Secondary | ICD-10-CM

## 2023-05-10 DIAGNOSIS — I340 Nonrheumatic mitral (valve) insufficiency: Secondary | ICD-10-CM | POA: Diagnosis not present

## 2023-05-10 DIAGNOSIS — I10 Essential (primary) hypertension: Secondary | ICD-10-CM | POA: Diagnosis not present

## 2023-05-10 DIAGNOSIS — Z720 Tobacco use: Secondary | ICD-10-CM | POA: Diagnosis not present

## 2023-05-10 DIAGNOSIS — Z0000 Encounter for general adult medical examination without abnormal findings: Secondary | ICD-10-CM | POA: Diagnosis not present

## 2023-05-10 DIAGNOSIS — I2510 Atherosclerotic heart disease of native coronary artery without angina pectoris: Secondary | ICD-10-CM | POA: Diagnosis not present

## 2023-05-10 DIAGNOSIS — I351 Nonrheumatic aortic (valve) insufficiency: Secondary | ICD-10-CM | POA: Diagnosis not present

## 2023-05-10 DIAGNOSIS — I700 Atherosclerosis of aorta: Secondary | ICD-10-CM | POA: Diagnosis not present

## 2023-05-10 DIAGNOSIS — E782 Mixed hyperlipidemia: Secondary | ICD-10-CM | POA: Diagnosis not present

## 2023-05-10 DIAGNOSIS — J432 Centrilobular emphysema: Secondary | ICD-10-CM | POA: Diagnosis not present

## 2023-05-10 DIAGNOSIS — I6523 Occlusion and stenosis of bilateral carotid arteries: Secondary | ICD-10-CM | POA: Diagnosis not present

## 2023-05-10 DIAGNOSIS — R972 Elevated prostate specific antigen [PSA]: Secondary | ICD-10-CM | POA: Diagnosis not present

## 2023-05-10 DIAGNOSIS — E559 Vitamin D deficiency, unspecified: Secondary | ICD-10-CM | POA: Diagnosis not present

## 2023-05-14 ENCOUNTER — Other Ambulatory Visit: Payer: Self-pay | Admitting: Family Medicine

## 2023-05-14 DIAGNOSIS — Z720 Tobacco use: Secondary | ICD-10-CM

## 2023-06-10 DIAGNOSIS — N289 Disorder of kidney and ureter, unspecified: Secondary | ICD-10-CM | POA: Diagnosis not present

## 2023-06-13 ENCOUNTER — Ambulatory Visit
Admission: RE | Admit: 2023-06-13 | Discharge: 2023-06-13 | Disposition: A | Discharge status: Home / Self Care | Source: Ambulatory Visit | Attending: Family Medicine | Admitting: Family Medicine

## 2023-06-13 DIAGNOSIS — Z87891 Personal history of nicotine dependence: Secondary | ICD-10-CM | POA: Diagnosis not present

## 2023-06-13 DIAGNOSIS — Z122 Encounter for screening for malignant neoplasm of respiratory organs: Secondary | ICD-10-CM | POA: Diagnosis not present

## 2023-06-13 DIAGNOSIS — Z720 Tobacco use: Secondary | ICD-10-CM

## 2023-07-02 ENCOUNTER — Ambulatory Visit (HOSPITAL_COMMUNITY)
Admission: RE | Admit: 2023-07-02 | Discharge: 2023-07-02 | Disposition: A | Discharge status: Home / Self Care | Payer: No Typology Code available for payment source | Source: Ambulatory Visit | Attending: Cardiology | Admitting: Cardiology

## 2023-07-02 DIAGNOSIS — I340 Nonrheumatic mitral (valve) insufficiency: Secondary | ICD-10-CM

## 2023-07-02 DIAGNOSIS — I351 Nonrheumatic aortic (valve) insufficiency: Secondary | ICD-10-CM | POA: Diagnosis not present

## 2023-07-02 LAB — ECHOCARDIOGRAM COMPLETE
Area-P 1/2: 3.17 cm2
MV M vel: 5.37 m/s
MV Peak grad: 115.3 mmHg
P 1/2 time: 380 ms
S' Lateral: 3.4 cm

## 2023-07-07 ENCOUNTER — Ambulatory Visit: Payer: Self-pay | Admitting: Cardiology

## 2023-07-07 DIAGNOSIS — I351 Nonrheumatic aortic (valve) insufficiency: Secondary | ICD-10-CM

## 2023-07-13 DIAGNOSIS — I2510 Atherosclerotic heart disease of native coronary artery without angina pectoris: Secondary | ICD-10-CM | POA: Diagnosis not present

## 2023-07-13 DIAGNOSIS — I340 Nonrheumatic mitral (valve) insufficiency: Secondary | ICD-10-CM | POA: Diagnosis not present

## 2023-07-13 DIAGNOSIS — E782 Mixed hyperlipidemia: Secondary | ICD-10-CM | POA: Diagnosis not present

## 2023-07-13 DIAGNOSIS — J432 Centrilobular emphysema: Secondary | ICD-10-CM | POA: Diagnosis not present

## 2023-08-12 DIAGNOSIS — I340 Nonrheumatic mitral (valve) insufficiency: Secondary | ICD-10-CM | POA: Diagnosis not present

## 2023-08-12 DIAGNOSIS — E782 Mixed hyperlipidemia: Secondary | ICD-10-CM | POA: Diagnosis not present

## 2023-08-12 DIAGNOSIS — J432 Centrilobular emphysema: Secondary | ICD-10-CM | POA: Diagnosis not present

## 2023-08-12 DIAGNOSIS — I2510 Atherosclerotic heart disease of native coronary artery without angina pectoris: Secondary | ICD-10-CM | POA: Diagnosis not present

## 2023-08-12 DIAGNOSIS — E559 Vitamin D deficiency, unspecified: Secondary | ICD-10-CM | POA: Diagnosis not present

## 2023-08-20 ENCOUNTER — Ambulatory Visit: Payer: No Typology Code available for payment source | Attending: Vascular Surgery | Admitting: Vascular Surgery

## 2023-08-20 ENCOUNTER — Encounter: Payer: Self-pay | Admitting: Vascular Surgery

## 2023-08-20 ENCOUNTER — Ambulatory Visit (HOSPITAL_COMMUNITY)
Admission: RE | Admit: 2023-08-20 | Discharge: 2023-08-20 | Disposition: A | Discharge status: Home / Self Care | Payer: No Typology Code available for payment source | Source: Ambulatory Visit | Attending: Vascular Surgery | Admitting: Vascular Surgery

## 2023-08-20 VITALS — BP 163/67 | HR 57 | Temp 98.1°F | Resp 18 | Ht 68.0 in | Wt 165.1 lb

## 2023-08-20 DIAGNOSIS — I6523 Occlusion and stenosis of bilateral carotid arteries: Secondary | ICD-10-CM | POA: Insufficient documentation

## 2023-08-20 NOTE — Progress Notes (Signed)
 Patient name: Gregory Barajas MRN: [id number] DOB: 1955/05/30 Sex: male  REASON FOR CONSULT: 6 month follow-up, carotid stenosis  HPI: Gregory Barajas is a 68 y.o. male, hx carotid stenosis, HTN, HLD that presents for 6 month follow-up for surveillance of his carotid artery disease.  He has no history of strokes or TIAs.  He had carotid ultrasound at [facility] imaging showing concern for 70 to 99% stenosis on the right ICA by peak systolic velocity but lesser degree of stenosis by ICA/CCA ratio.  Similarly he had a peak systolic velocity suggesting 50 to 69% on the left ICA but lesser degree of stenosis by ICA/CCA ratio.    Duplex here showed 60 to 79% right ICA stenosis and 40 to 59% left ICA stenosis.  He reports no stroke or TIA symptoms over the last 6 months.  Has now quit smoking for over 6 months.  Still on aspirin  and statin.  No new concerns today.    Past Medical History:  Diagnosis Date   Aortic atherosclerosis (HCC)    Carotid stenosis    Coronary atherosclerosis due to calcified coronary lesion    High cholesterol    Hypertension     Past Surgical History:  Procedure Laterality Date   BUNIONECTOMY     CATARACT EXTRACTION      Family History  Problem Relation Age of Onset   Cancer Brother     SOCIAL HISTORY: Social History   Socioeconomic History   Marital status: Single    Spouse name: Not on file   Number of children: 2   Years of education: Not on file   Highest education level: Not on file  Occupational History   Not on file  Tobacco Use   Smoking status: Former    Current packs/day: 0.00    Types: Cigarettes    Quit date: 11/2022    Years since quitting: 0.7   Smokeless tobacco: Never  Vaping Use   Vaping status: Never Used  Substance and Sexual Activity   Alcohol use: Yes    Alcohol/week: 1.0 standard drink of alcohol    Types: 1 Glasses of wine per week    Comment: OCC   Drug use: No   Sexual activity: Not on file  Other Topics Concern    Not on file  Social History Narrative   Not on file   Social Drivers of Health   Financial Resource Strain: Not on file  Food Insecurity: Not on file  Transportation Needs: Not on file  Physical Activity: Not on file  Stress: Not on file  Social Connections: Not on file  Intimate Partner Violence: Not on file    No Known Allergies  Current Outpatient Medications  Medication Sig Dispense Refill   amLODipine (NORVASC) 10 MG tablet Take 1 tablet by mouth every evening.     aspirin  EC 81 MG tablet Take 1 tablet (81 mg total) by mouth daily. Swallow whole. 30 tablet 12   lisinopril  (ZESTRIL ) 40 MG tablet Take 1 tablet (40 mg total) by mouth in the morning. 90 tablet 3   Rosuvastatin Calcium 40 MG CPSP Take 40 mg by mouth daily.     tadalafil (CIALIS) 20 MG tablet Take 20 mg by mouth as directed.     No current facility-administered medications for this visit.    REVIEW OF SYSTEMS:  [X]  denotes positive finding, [ ]  denotes negative finding Cardiac  Comments:  Chest pain or chest pressure:    Shortness of breath upon  exertion:    Short of breath when lying flat:    Irregular heart rhythm:        Vascular    Pain in calf, thigh, or hip brought on by ambulation:    Pain in feet at night that wakes you up from your sleep:     Blood clot in your veins:    Leg swelling:         Pulmonary    Oxygen at home:    Productive cough:     Wheezing:         Neurologic    Sudden weakness in arms or legs:     Sudden numbness in arms or legs:     Sudden onset of difficulty speaking or slurred speech:    Temporary loss of vision in one eye:     Problems with dizziness:         Gastrointestinal    Blood in stool:     Vomited blood:         Genitourinary    Burning when urinating:     Blood in urine:        Psychiatric    Major depression:         Hematologic    Bleeding problems:    Problems with blood clotting too easily:        Skin    Rashes or ulcers:         Constitutional    Fever or chills:      PHYSICAL EXAM: Vitals:   08/20/23 1037 08/20/23 1040  BP: (!) 161/68 (!) 163/67  Pulse: (!) 57   Resp: 18   Temp: 98.1 F (36.7 C)   TempSrc: Temporal   SpO2: 100%   Weight: 165 lb 1.6 oz (74.9 kg)   Height: 5' 8 (1.727 m)      GENERAL: The patient is a well-nourished male, in no acute distress. The vital signs are documented above. CARDIAC: There is a regular rate and rhythm.  VASCULAR:  Bilateral radial pulses palpable PULMONARY: No respiratory distress. ABDOMEN: Soft and non-tender. MUSCULOSKELETAL: There are no major deformities or cyanosis. NEUROLOGIC: No focal weakness or paresthesias are detected.  CN II-XII grossly intact.   PSYCHIATRIC: The patient has a normal affect.  DATA:   Carotid duplex today shows 60 to 79% right ICA stenosis and a 40 to 59% left ICA stenosis  Assessment/Plan:  68 year old male presents for 42-month follow-up for his carotid artery disease.  Previously seen with a 60 to 79% right ICA stenosis and a 40 to 59% left ICA stenosis.  His carotid artery disease remains asymptomatic and again has a 60 to 79% right ICA stenosis and a 40 to 59% left ICA stenosis by velocity criteria based on duplex today.  I discussed no indication for surgical intervention.  I discussed for asymptomatic carotid disease recommend surgery for greater than 80% stenosis.  I will have him follow-up with me again in 6 months with repeat carotid ultrasound.  Discussed he remain on aspirin  statin for risk reduction.  Discussed he call with questions or concerns.  Lonni DOROTHA Gaskins, MD [facility] [place] Office: [phone]

## 2023-08-21 ENCOUNTER — Other Ambulatory Visit: Payer: Self-pay

## 2023-08-21 DIAGNOSIS — I6523 Occlusion and stenosis of bilateral carotid arteries: Secondary | ICD-10-CM

## 2023-09-12 DIAGNOSIS — I2510 Atherosclerotic heart disease of native coronary artery without angina pectoris: Secondary | ICD-10-CM | POA: Diagnosis not present

## 2023-09-12 DIAGNOSIS — E782 Mixed hyperlipidemia: Secondary | ICD-10-CM | POA: Diagnosis not present

## 2023-09-12 DIAGNOSIS — J432 Centrilobular emphysema: Secondary | ICD-10-CM | POA: Diagnosis not present

## 2023-09-12 DIAGNOSIS — I340 Nonrheumatic mitral (valve) insufficiency: Secondary | ICD-10-CM | POA: Diagnosis not present

## 2023-09-30 DIAGNOSIS — Z6825 Body mass index (BMI) 25.0-25.9, adult: Secondary | ICD-10-CM | POA: Diagnosis not present

## 2023-09-30 DIAGNOSIS — I10 Essential (primary) hypertension: Secondary | ICD-10-CM | POA: Diagnosis not present

## 2023-09-30 DIAGNOSIS — E785 Hyperlipidemia, unspecified: Secondary | ICD-10-CM | POA: Diagnosis not present

## 2023-09-30 DIAGNOSIS — Z860100 Personal history of colon polyps, unspecified: Secondary | ICD-10-CM | POA: Diagnosis not present

## 2023-09-30 DIAGNOSIS — Z008 Encounter for other general examination: Secondary | ICD-10-CM | POA: Diagnosis not present

## 2023-09-30 DIAGNOSIS — E663 Overweight: Secondary | ICD-10-CM | POA: Diagnosis not present

## 2023-10-13 DIAGNOSIS — J432 Centrilobular emphysema: Secondary | ICD-10-CM | POA: Diagnosis not present

## 2023-10-13 DIAGNOSIS — I340 Nonrheumatic mitral (valve) insufficiency: Secondary | ICD-10-CM | POA: Diagnosis not present

## 2023-10-13 DIAGNOSIS — I2510 Atherosclerotic heart disease of native coronary artery without angina pectoris: Secondary | ICD-10-CM | POA: Diagnosis not present

## 2023-10-13 DIAGNOSIS — E782 Mixed hyperlipidemia: Secondary | ICD-10-CM | POA: Diagnosis not present

## 2023-11-12 DIAGNOSIS — I340 Nonrheumatic mitral (valve) insufficiency: Secondary | ICD-10-CM | POA: Diagnosis not present

## 2023-11-12 DIAGNOSIS — I2510 Atherosclerotic heart disease of native coronary artery without angina pectoris: Secondary | ICD-10-CM | POA: Diagnosis not present

## 2023-11-12 DIAGNOSIS — E782 Mixed hyperlipidemia: Secondary | ICD-10-CM | POA: Diagnosis not present

## 2023-11-12 DIAGNOSIS — J432 Centrilobular emphysema: Secondary | ICD-10-CM | POA: Diagnosis not present

## 2023-11-20 DIAGNOSIS — E559 Vitamin D deficiency, unspecified: Secondary | ICD-10-CM | POA: Diagnosis not present

## 2023-11-20 DIAGNOSIS — R972 Elevated prostate specific antigen [PSA]: Secondary | ICD-10-CM | POA: Diagnosis not present

## 2024-02-18 ENCOUNTER — Ambulatory Visit: Admitting: Vascular Surgery

## 2024-02-18 ENCOUNTER — Encounter (HOSPITAL_COMMUNITY)

## 2024-02-25 ENCOUNTER — Ambulatory Visit: Admitting: Vascular Surgery

## 2024-02-25 ENCOUNTER — Ambulatory Visit (HOSPITAL_COMMUNITY)
Admission: RE | Admit: 2024-02-25 | Discharge: 2024-02-25 | Disposition: A | Discharge status: Home / Self Care | Source: Ambulatory Visit | Attending: Vascular Surgery | Admitting: Vascular Surgery

## 2024-02-25 ENCOUNTER — Encounter: Payer: Self-pay | Admitting: Vascular Surgery

## 2024-02-25 VITALS — BP 158/72 | HR 57 | Temp 98.1°F | Resp 18 | Ht 68.0 in | Wt 151.8 lb

## 2024-02-25 DIAGNOSIS — I6523 Occlusion and stenosis of bilateral carotid arteries: Secondary | ICD-10-CM | POA: Diagnosis not present

## 2024-02-25 NOTE — Progress Notes (Signed)
 "   Patient name: Gregory Barajas MRN: [id number] DOB: Apr 23, 1955 Sex: male  REASON FOR CONSULT: 6 month follow-up, carotid stenosis  HPI: Gregory Barajas is a 69 y.o. male, hx carotid stenosis, HTN, HLD that presents for 6 month follow-up for surveillance of his carotid artery disease.  He has no history of strokes or TIAs.  He had carotid ultrasound at [facility] imaging showing concern for 70 to 99% stenosis on the right ICA by peak systolic velocity but lesser degree of stenosis by ICA/CCA ratio.  Similarly he had a peak systolic velocity suggesting 50 to 69% on the left ICA but lesser degree of stenosis by ICA/CCA ratio.    Last duplex here showed 60 to 79% right ICA stenosis and 40 to 59% left ICA stenosis.  He reports no stroke or TIA symptoms over the last 6 months.   Still on aspirin  and statin.  No new concerns today.    Past Medical History:  Diagnosis Date   Aortic atherosclerosis    Carotid stenosis    Coronary atherosclerosis due to calcified coronary lesion    High cholesterol    Hypertension     Past Surgical History:  Procedure Laterality Date   BUNIONECTOMY     CATARACT EXTRACTION      Family History  Problem Relation Age of Onset   Cancer Brother     SOCIAL HISTORY: Social History   Socioeconomic History   Marital status: Single    Spouse name: Not on file   Number of children: 2   Years of education: Not on file   Highest education level: Not on file  Occupational History   Not on file  Tobacco Use   Smoking status: Every Day    Current packs/day: 0.00    Types: Cigarettes    Last attempt to quit: 11/2022    Years since quitting: 1.2   Smokeless tobacco: Never  Vaping Use   Vaping status: Never Used  Substance and Sexual Activity   Alcohol use: Yes    Alcohol/week: 1.0 standard drink of alcohol    Types: 1 Glasses of wine per week    Comment: OCC   Drug use: No   Sexual activity: Not on file  Other Topics Concern   Not on file  Social  History Narrative   Not on file   Social Drivers of Health   Tobacco Use: High Risk (02/25/2024)   Patient History    Smoking Tobacco Use: Every Day    Smokeless Tobacco Use: Never    Passive Exposure: Not on file  Financial Resource Strain: Not on file  Food Insecurity: Not on file  Transportation Needs: Not on file  Physical Activity: Not on file  Stress: Not on file  Social Connections: Not on file  Intimate Partner Violence: Not on file  Depression (EYV7-0): Not on file  Alcohol Screen: Not on file  Housing: Not on file  Utilities: Not on file  Health Literacy: Not on file    No Known Allergies  Current Outpatient Medications  Medication Sig Dispense Refill   amLODipine (NORVASC) 10 MG tablet Take 1 tablet by mouth every evening.     aspirin  EC 81 MG tablet Take 1 tablet (81 mg total) by mouth daily. Swallow whole. 30 tablet 12   lisinopril  (ZESTRIL ) 40 MG tablet Take 1 tablet (40 mg total) by mouth in the morning. 90 tablet 3   Rosuvastatin Calcium 40 MG CPSP Take 40 mg by mouth daily.  tadalafil (CIALIS) 20 MG tablet Take 20 mg by mouth as directed.     No current facility-administered medications for this visit.    REVIEW OF SYSTEMS:  [X]  denotes positive finding, [ ]  denotes negative finding Cardiac  Comments:  Chest pain or chest pressure:    Shortness of breath upon exertion:    Short of breath when lying flat:    Irregular heart rhythm:        Vascular    Pain in calf, thigh, or hip brought on by ambulation:    Pain in feet at night that wakes you up from your sleep:     Blood clot in your veins:    Leg swelling:         Pulmonary    Oxygen at home:    Productive cough:     Wheezing:         Neurologic    Sudden weakness in arms or legs:     Sudden numbness in arms or legs:     Sudden onset of difficulty speaking or slurred speech:    Temporary loss of vision in one eye:     Problems with dizziness:         Gastrointestinal    Blood in  stool:     Vomited blood:         Genitourinary    Burning when urinating:     Blood in urine:        Psychiatric    Major depression:         Hematologic    Bleeding problems:    Problems with blood clotting too easily:        Skin    Rashes or ulcers:        Constitutional    Fever or chills:      PHYSICAL EXAM: Vitals:   02/25/24 1551 02/25/24 1553  BP: (!) 167/67 (!) 158/72  Pulse: (!) 57   Resp: 18   Temp: 98.1 F (36.7 C)   TempSrc: Temporal   SpO2: 99%   Weight: 151 lb 12.8 oz (68.9 kg)   Height: 5' 8 (1.727 m)      GENERAL: The patient is a well-nourished male, in no acute distress. The vital signs are documented above. CARDIAC: There is a regular rate and rhythm.  VASCULAR:  Bilateral radial pulses palpable PULMONARY: No respiratory distress. ABDOMEN: Soft and non-tender. MUSCULOSKELETAL: There are no major deformities or cyanosis. NEUROLOGIC: No focal weakness or paresthesias are detected.  CN II-XII grossly intact.   PSYCHIATRIC: The patient has a normal affect.  DATA:   Carotid duplex today shows 60 to 79% right ICA stenosis and a 40 to 59% left ICA stenosis (stable velocities over past 6 months)  Assessment/Plan:  69 year old male presents for 42-month follow-up for his carotid artery disease.  Previously seen with a 60 to 79% right ICA stenosis and a 40 to 59% left ICA stenosis.  His carotid artery disease remains asymptomatic and again has a 60 to 79% right ICA stenosis and a 40 to 59% left ICA stenosis by velocity criteria based on duplex today.  I discussed no indication for surgical intervention.  I discussed for asymptomatic carotid disease recommend surgery for greater than 80% stenosis.  I will have him follow-up with me again in 6 months with repeat carotid ultrasound for ongoing surveillance.  Discussed he remain on aspirin  statin for risk reduction.  Discussed he call with questions or concerns.  Lonni PARAS.  Gretta, MD [facility]  [facility] Office: [phone]     "

## 2024-02-26 ENCOUNTER — Other Ambulatory Visit: Payer: Self-pay

## 2024-02-26 DIAGNOSIS — I6523 Occlusion and stenosis of bilateral carotid arteries: Secondary | ICD-10-CM

## 2024-08-25 ENCOUNTER — Ambulatory Visit: Admitting: Vascular Surgery

## 2024-08-25 ENCOUNTER — Ambulatory Visit (HOSPITAL_COMMUNITY)
